# Patient Record
Sex: Female | Born: 1935 | Race: White | Hispanic: No | Smoking: Current some day smoker
Health system: Southern US, Community
[De-identification: ages and names within clinical notes are randomized; demographics above are authoritative.]

## PROBLEM LIST (undated history)

## (undated) DIAGNOSIS — R079 Chest pain, unspecified: Secondary | ICD-10-CM

## (undated) DIAGNOSIS — I10 Essential (primary) hypertension: Secondary | ICD-10-CM

## (undated) DIAGNOSIS — R479 Unspecified speech disturbances: Secondary | ICD-10-CM

## (undated) DIAGNOSIS — M2420 Disorder of ligament, unspecified site: Secondary | ICD-10-CM

## (undated) DIAGNOSIS — S329XXA Fracture of unspecified parts of lumbosacral spine and pelvis, initial encounter for closed fracture: Secondary | ICD-10-CM

## (undated) DIAGNOSIS — S32511D Fracture of superior rim of right pubis, subsequent encounter for fracture with routine healing: Secondary | ICD-10-CM

## (undated) DIAGNOSIS — I2510 Atherosclerotic heart disease of native coronary artery without angina pectoris: Secondary | ICD-10-CM

## (undated) DIAGNOSIS — F419 Anxiety disorder, unspecified: Secondary | ICD-10-CM

## (undated) DIAGNOSIS — R531 Weakness: Secondary | ICD-10-CM

## (undated) DIAGNOSIS — N289 Disorder of kidney and ureter, unspecified: Secondary | ICD-10-CM

## (undated) DIAGNOSIS — D649 Anemia, unspecified: Secondary | ICD-10-CM

## (undated) DIAGNOSIS — G309 Alzheimer's disease, unspecified: Secondary | ICD-10-CM

## (undated) DIAGNOSIS — M419 Scoliosis, unspecified: Secondary | ICD-10-CM

## (undated) DIAGNOSIS — M629 Disorder of muscle, unspecified: Secondary | ICD-10-CM

## (undated) DIAGNOSIS — I82409 Acute embolism and thrombosis of unspecified deep veins of unspecified lower extremity: Secondary | ICD-10-CM

## (undated) DIAGNOSIS — F0280 Dementia in other diseases classified elsewhere without behavioral disturbance: Secondary | ICD-10-CM

## (undated) DIAGNOSIS — E039 Hypothyroidism, unspecified: Secondary | ICD-10-CM

## (undated) DIAGNOSIS — F99 Mental disorder, not otherwise specified: Secondary | ICD-10-CM

## (undated) DIAGNOSIS — R1310 Dysphagia, unspecified: Secondary | ICD-10-CM

## (undated) HISTORY — PX: SKIN GRAFT: SHX250

## (undated) HISTORY — PX: CHOLECYSTECTOMY: SHX55

---

## 1998-04-22 ENCOUNTER — Ambulatory Visit (HOSPITAL_COMMUNITY): Admission: RE | Admit: 1998-04-22 | Discharge: 1998-04-22 | Payer: Self-pay | Admitting: Internal Medicine

## 1998-05-20 ENCOUNTER — Other Ambulatory Visit: Admission: RE | Admit: 1998-05-20 | Discharge: 1998-05-20 | Payer: Self-pay | Admitting: Internal Medicine

## 1998-10-05 ENCOUNTER — Ambulatory Visit (HOSPITAL_COMMUNITY): Admission: RE | Admit: 1998-10-05 | Discharge: 1998-10-05 | Payer: Self-pay | Admitting: Internal Medicine

## 1999-10-15 ENCOUNTER — Ambulatory Visit (HOSPITAL_COMMUNITY): Admission: RE | Admit: 1999-10-15 | Discharge: 1999-10-15 | Payer: Self-pay | Admitting: Internal Medicine

## 1999-10-15 ENCOUNTER — Encounter: Payer: Self-pay | Admitting: Internal Medicine

## 2000-11-16 ENCOUNTER — Ambulatory Visit (HOSPITAL_COMMUNITY): Admission: RE | Admit: 2000-11-16 | Discharge: 2000-11-16 | Payer: Self-pay | Admitting: Internal Medicine

## 2000-11-16 ENCOUNTER — Encounter: Payer: Self-pay | Admitting: Internal Medicine

## 2001-11-19 ENCOUNTER — Ambulatory Visit (HOSPITAL_COMMUNITY): Admission: RE | Admit: 2001-11-19 | Discharge: 2001-11-19 | Payer: Self-pay | Admitting: Internal Medicine

## 2001-11-19 ENCOUNTER — Encounter: Payer: Self-pay | Admitting: Internal Medicine

## 2002-11-20 ENCOUNTER — Ambulatory Visit (HOSPITAL_COMMUNITY): Admission: RE | Admit: 2002-11-20 | Discharge: 2002-11-20 | Payer: Self-pay | Admitting: Internal Medicine

## 2002-11-20 ENCOUNTER — Encounter: Payer: Self-pay | Admitting: Internal Medicine

## 2003-11-24 ENCOUNTER — Ambulatory Visit (HOSPITAL_COMMUNITY): Admission: RE | Admit: 2003-11-24 | Discharge: 2003-11-24 | Payer: Self-pay | Admitting: Internal Medicine

## 2004-12-06 ENCOUNTER — Ambulatory Visit (HOSPITAL_COMMUNITY): Admission: RE | Admit: 2004-12-06 | Discharge: 2004-12-06 | Payer: Self-pay | Admitting: Internal Medicine

## 2006-01-03 ENCOUNTER — Ambulatory Visit (HOSPITAL_COMMUNITY): Admission: RE | Admit: 2006-01-03 | Discharge: 2006-01-03 | Payer: Self-pay | Admitting: Internal Medicine

## 2007-02-07 ENCOUNTER — Ambulatory Visit (HOSPITAL_COMMUNITY): Admission: RE | Admit: 2007-02-07 | Discharge: 2007-02-07 | Payer: Self-pay | Admitting: Internal Medicine

## 2007-02-20 ENCOUNTER — Encounter: Admission: RE | Admit: 2007-02-20 | Discharge: 2007-02-20 | Payer: Self-pay | Admitting: Internal Medicine

## 2008-03-12 ENCOUNTER — Ambulatory Visit (HOSPITAL_COMMUNITY): Admission: RE | Admit: 2008-03-12 | Discharge: 2008-03-12 | Payer: Self-pay | Admitting: Internal Medicine

## 2009-06-16 ENCOUNTER — Ambulatory Visit (HOSPITAL_COMMUNITY): Admission: RE | Admit: 2009-06-16 | Discharge: 2009-06-16 | Payer: Self-pay | Admitting: Internal Medicine

## 2009-10-19 ENCOUNTER — Ambulatory Visit (HOSPITAL_COMMUNITY): Admission: RE | Admit: 2009-10-19 | Discharge: 2009-10-19 | Payer: Self-pay | Admitting: Internal Medicine

## 2010-12-12 ENCOUNTER — Encounter: Payer: Self-pay | Admitting: Internal Medicine

## 2011-11-22 DIAGNOSIS — R079 Chest pain, unspecified: Secondary | ICD-10-CM

## 2011-11-22 HISTORY — DX: Chest pain, unspecified: R07.9

## 2012-01-20 ENCOUNTER — Other Ambulatory Visit: Payer: Self-pay | Admitting: Internal Medicine

## 2012-01-20 DIAGNOSIS — R4182 Altered mental status, unspecified: Secondary | ICD-10-CM

## 2012-01-20 DIAGNOSIS — R413 Other amnesia: Secondary | ICD-10-CM

## 2012-01-23 ENCOUNTER — Ambulatory Visit
Admission: RE | Admit: 2012-01-23 | Discharge: 2012-01-23 | Disposition: A | Discharge status: Home / Self Care | Payer: Medicare Other | Source: Ambulatory Visit | Attending: Internal Medicine | Admitting: Internal Medicine

## 2012-01-23 DIAGNOSIS — R4182 Altered mental status, unspecified: Secondary | ICD-10-CM

## 2012-01-23 DIAGNOSIS — R413 Other amnesia: Secondary | ICD-10-CM

## 2012-04-23 ENCOUNTER — Observation Stay (HOSPITAL_COMMUNITY)
Admission: EM | Admit: 2012-04-23 | Discharge: 2012-04-27 | Disposition: A | Discharge status: Skilled Nursing Facility | Payer: Medicare Other | Attending: Internal Medicine | Admitting: Internal Medicine

## 2012-04-23 ENCOUNTER — Encounter (HOSPITAL_COMMUNITY): Payer: Self-pay | Admitting: *Deleted

## 2012-04-23 ENCOUNTER — Emergency Department (HOSPITAL_COMMUNITY): Payer: Medicare Other

## 2012-04-23 DIAGNOSIS — N19 Unspecified kidney failure: Secondary | ICD-10-CM

## 2012-04-23 DIAGNOSIS — R079 Chest pain, unspecified: Principal | ICD-10-CM | POA: Insufficient documentation

## 2012-04-23 DIAGNOSIS — N289 Disorder of kidney and ureter, unspecified: Secondary | ICD-10-CM | POA: Insufficient documentation

## 2012-04-23 DIAGNOSIS — N179 Acute kidney failure, unspecified: Secondary | ICD-10-CM | POA: Insufficient documentation

## 2012-04-23 DIAGNOSIS — I10 Essential (primary) hypertension: Secondary | ICD-10-CM | POA: Diagnosis present

## 2012-04-23 DIAGNOSIS — E039 Hypothyroidism, unspecified: Secondary | ICD-10-CM | POA: Diagnosis present

## 2012-04-23 DIAGNOSIS — E032 Hypothyroidism due to medicaments and other exogenous substances: Secondary | ICD-10-CM

## 2012-04-23 DIAGNOSIS — N200 Calculus of kidney: Secondary | ICD-10-CM | POA: Insufficient documentation

## 2012-04-23 DIAGNOSIS — R0609 Other forms of dyspnea: Secondary | ICD-10-CM | POA: Insufficient documentation

## 2012-04-23 DIAGNOSIS — R0989 Other specified symptoms and signs involving the circulatory and respiratory systems: Secondary | ICD-10-CM | POA: Insufficient documentation

## 2012-04-23 DIAGNOSIS — F0390 Unspecified dementia without behavioral disturbance: Secondary | ICD-10-CM | POA: Insufficient documentation

## 2012-04-23 HISTORY — DX: Hypothyroidism, unspecified: E03.9

## 2012-04-23 HISTORY — DX: Anxiety disorder, unspecified: F41.9

## 2012-04-23 HISTORY — DX: Mental disorder, not otherwise specified: F99

## 2012-04-23 HISTORY — DX: Essential (primary) hypertension: I10

## 2012-04-23 LAB — CBC
HCT: 43.4 % (ref 36.0–46.0)
Hemoglobin: 15.3 g/dL — ABNORMAL HIGH (ref 12.0–15.0)
MCHC: 35.3 g/dL (ref 30.0–36.0)
Platelets: 209 10*3/uL (ref 150–400)
RBC: 4.64 MIL/uL (ref 3.87–5.11)
RDW: 13.8 % (ref 11.5–15.5)

## 2012-04-23 LAB — POCT I-STAT 3, VENOUS BLOOD GAS (G3P V)
Acid-Base Excess: 1 mmol/L (ref 0.0–2.0)
TCO2: 30 mmol/L (ref 0–100)
pCO2, Ven: 52.9 mmHg — ABNORMAL HIGH (ref 45.0–50.0)

## 2012-04-23 LAB — BASIC METABOLIC PANEL
Chloride: 103 mEq/L (ref 96–112)
Creatinine, Ser: 2.33 mg/dL — ABNORMAL HIGH (ref 0.50–1.10)

## 2012-04-23 LAB — CK TOTAL AND CKMB (NOT AT ARMC)
CK, MB: 4.7 ng/mL — ABNORMAL HIGH (ref 0.3–4.0)
Total CK: 122 U/L (ref 7–177)

## 2012-04-23 MED ORDER — ASPIRIN 81 MG PO CHEW
324.0000 mg | CHEWABLE_TABLET | Freq: Once | ORAL | Status: AC
  Administered 2012-04-23: 324 mg via ORAL
  Filled 2012-04-23: qty 4

## 2012-04-23 NOTE — ED Provider Notes (Signed)
History     CSN: [id number]  Arrival date & time 04/23/12  2002   First MD Initiated Contact with Patient 04/23/12 2301      Chief Complaint  Patient presents with  . Chest Pain    (Consider location/radiation/quality/duration/timing/severity/associated sxs/prior treatment) The history is provided by the patient.   lives alone, does have mild dementia. She called to her son and daughter tonight stating she is having chest pain, difficulty breathing, and breaking out into a sweat. The symptoms lasted about 2 hours until she got to the emergency department and now is pain-free. Pain described as heaviness and substernal and not radiating. No recent illness. No cough or fevers. No leg pain or leg swelling. No history of similar symptoms. Patient does have history of hypertension but no known history of heart problems. Moderate in severity. No known aggravating or alleviating factors.  No past medical history on file.  No past surgical history on file.  History reviewed. No pertinent family history.  History  Substance Use Topics  . Smoking status: Never Smoker   . Smokeless tobacco: Not on file  . Alcohol Use: No    OB History    Grav Para Term Preterm Abortions TAB SAB Ect Mult Living                  Review of Systems  Constitutional: Negative for fever and chills.  HENT: Negative for neck pain and neck stiffness.   Eyes: Negative for pain.  Respiratory: Positive for shortness of breath.   Cardiovascular: Positive for chest pain.  Gastrointestinal: Negative for abdominal pain.  Genitourinary: Negative for dysuria.  Musculoskeletal: Negative for back pain.  Skin: Negative for rash.  Neurological: Negative for headaches.  All other systems reviewed and are negative.    Allergies  Review of patient's allergies indicates not on file.  Home Medications  No current outpatient prescriptions on file.  BP 134/68  Pulse 70  Temp(Src) 98.1 F (36.7 C) (Oral)  Resp 16   SpO2 99%  Physical Exam  Constitutional: She is oriented to person, place, and time. She appears well-developed and well-nourished.  HENT:  Head: Normocephalic and atraumatic.  Eyes: Conjunctivae and EOM are normal. Pupils are equal, round, and reactive to light.  Neck: Trachea normal. Neck supple. No thyromegaly present.  Cardiovascular: Normal rate, regular rhythm, S1 normal, S2 normal and normal pulses.     No systolic murmur is present   No diastolic murmur is present  Pulses:      Radial pulses are 2+ on the right side, and 2+ on the left side.  Pulmonary/Chest: Effort normal and breath sounds normal. She has no wheezes. She has no rhonchi. She has no rales. She exhibits no tenderness.  Abdominal: Soft. Normal appearance and bowel sounds are normal. There is no tenderness. There is no CVA tenderness and negative Murphy's sign.  Musculoskeletal:       BLE:s Calves nontender, no cords or erythema, negative Homans sign  Neurological: She is alert and oriented to person, place, and time. She has normal strength. No cranial nerve deficit or sensory deficit. GCS eye subscore is 4. GCS verbal subscore is 5. GCS motor subscore is 6.  Skin: Skin is warm and dry. No rash noted. She is not diaphoretic.  Psychiatric: Her speech is normal.       Cooperative and appropriate    ED Course  Procedures (including critical care time)  Labs Reviewed  CBC - Abnormal; Notable for the  following:    Hemoglobin 15.3 (*)    All other components within normal limits  BASIC METABOLIC PANEL - Abnormal; Notable for the following:    BUN 55 (*)    Creatinine, Ser 2.33 (*)    GFR calc non Af Amer 18 (*)    GFR calc Af Amer 21 (*)    All other components within normal limits  CK TOTAL AND CKMB - Abnormal; Notable for the following:    CK, MB 4.7 (*)    Relative Index 3.9 (*)    All other components within normal limits  POCT I-STAT 3, BLOOD GAS (G3P V) - Abnormal; Notable for the following:    pH,  Ven 7.334 (*)    pCO2, Ven 52.9 (*)    pO2, Ven 18.0 (*)    Bicarbonate 28.2 (*)    All other components within normal limits  POCT I-STAT TROPONIN I   Dg Chest 2 View  04/23/2012  *RADIOLOGY REPORT*  Clinical Data: Chest pain.  CHEST - 2 VIEW  Comparison: 10/19/2009  Findings: Hyperexpansion is consistent with emphysema. The lungs are clear without focal infiltrate, edema, pneumothorax or pleural effusion. The cardiopericardial silhouette is within normal limits for size. Bones are diffusely demineralized.  IMPRESSION: Stable.  Emphysema without acute cardiopulmonary process.  Original Report Authenticated By: ERIC A. MANSELL, M.D.     Date: 04/23/2012  Rate: 70  Rhythm: normal sinus rhythm  QRS Axis: normal  Intervals: normal  ST/T Wave abnormalities: nonspecific ST changes  Conduction Disutrbances:none  Narrative Interpretation:   Old EKG Reviewed: none available  ASA PO. Pain free at time of my evaluation.   11:25 PM d/w Dr Mikeal Hawthorne, on call for [facility], will admit.    MDM   CP concerning for possible ACS in 78 yo female with h/o HTN, o/w without cardiac history. Work up as above, stat ECG reviewed, CXR and labs reviewed and MED c/s for admit.         Sunnie Nielsen, MD 04/24/12 [phone]

## 2012-04-23 NOTE — ED Notes (Signed)
Patient with two days of intermittent chest pain that radiates to her neck then down her cental chest to her epigastric area.  Describes pain as tightness.  Patient also experienced dizziness and shortness of breath with diaphoresis today.  Patient is pain free at this time.

## 2012-04-24 ENCOUNTER — Encounter (HOSPITAL_COMMUNITY): Payer: Self-pay | Admitting: Internal Medicine

## 2012-04-24 ENCOUNTER — Observation Stay (HOSPITAL_COMMUNITY): Payer: Medicare Other

## 2012-04-24 DIAGNOSIS — I10 Essential (primary) hypertension: Secondary | ICD-10-CM

## 2012-04-24 DIAGNOSIS — N19 Unspecified kidney failure: Secondary | ICD-10-CM

## 2012-04-24 DIAGNOSIS — F0390 Unspecified dementia without behavioral disturbance: Secondary | ICD-10-CM | POA: Diagnosis present

## 2012-04-24 DIAGNOSIS — E032 Hypothyroidism due to medicaments and other exogenous substances: Secondary | ICD-10-CM

## 2012-04-24 DIAGNOSIS — E039 Hypothyroidism, unspecified: Secondary | ICD-10-CM | POA: Diagnosis present

## 2012-04-24 DIAGNOSIS — R079 Chest pain, unspecified: Secondary | ICD-10-CM | POA: Diagnosis present

## 2012-04-24 DIAGNOSIS — N289 Disorder of kidney and ureter, unspecified: Secondary | ICD-10-CM | POA: Diagnosis present

## 2012-04-24 LAB — CARDIAC PANEL(CRET KIN+CKTOT+MB+TROPI)
CK, MB: 4 ng/mL (ref 0.3–4.0)
CK, MB: 4.2 ng/mL — ABNORMAL HIGH (ref 0.3–4.0)
Total CK: 106 U/L (ref 7–177)
Troponin I: <0.3 ng/mL (ref <0.30)
Troponin I: <0.3 ng/mL (ref <0.30)

## 2012-04-24 LAB — COMPREHENSIVE METABOLIC PANEL
Alkaline Phosphatase: 38 U/L — ABNORMAL LOW (ref 39–117)
BUN: 47 mg/dL — ABNORMAL HIGH (ref 6–23)
CO2: 24 mEq/L (ref 19–32)
GFR calc Af Amer: 24 mL/min — ABNORMAL LOW (ref >90)
GFR calc non Af Amer: 21 mL/min — ABNORMAL LOW (ref >90)
Glucose, Bld: 83 mg/dL (ref 70–99)
Potassium: 4.2 mEq/L (ref 3.5–5.1)
Total Protein: 5.9 g/dL — ABNORMAL LOW (ref 6.0–8.3)

## 2012-04-24 LAB — LIPID PANEL
Cholesterol: 196 mg/dL (ref 0–200)
Total CHOL/HDL Ratio: 3 RATIO
Triglycerides: 86 mg/dL (ref <150)
VLDL: 17 mg/dL (ref 0–40)

## 2012-04-24 LAB — CBC
HCT: 41.7 % (ref 36.0–46.0)
Platelets: 186 10*3/uL (ref 150–400)
RDW: 13.6 % (ref 11.5–15.5)
WBC: 7.9 10*3/uL (ref 4.0–10.5)

## 2012-04-24 LAB — TSH: TSH: 5.278 u[IU]/mL — ABNORMAL HIGH (ref 0.350–4.500)

## 2012-04-24 MED ORDER — DOCUSATE SODIUM 100 MG PO CAPS
100.0000 mg | ORAL_CAPSULE | Freq: Two times a day (BID) | ORAL | Status: DC
  Administered 2012-04-24 – 2012-04-27 (×7): 100 mg via ORAL
  Filled 2012-04-24 (×8): qty 1

## 2012-04-24 MED ORDER — ENOXAPARIN SODIUM 30 MG/0.3ML ~~LOC~~ SOLN
30.0000 mg | Freq: Every day | SUBCUTANEOUS | Status: DC
  Administered 2012-04-24 – 2012-04-27 (×4): 30 mg via SUBCUTANEOUS
  Filled 2012-04-24 (×4): qty 0.3

## 2012-04-24 MED ORDER — ACETAMINOPHEN 650 MG RE SUPP: 650.0000 mg | Freq: Four times a day (QID) | RECTAL | Status: DC | PRN

## 2012-04-24 MED ORDER — NITROGLYCERIN 0.4 MG SL SUBL: 0.4000 mg | SUBLINGUAL_TABLET | SUBLINGUAL | Status: DC | PRN

## 2012-04-24 MED ORDER — ENSURE COMPLETE PO LIQD: 237.0000 mL | Freq: Two times a day (BID) | ORAL | Status: DC | PRN

## 2012-04-24 MED ORDER — METOPROLOL TARTRATE 12.5 MG HALF TABLET
12.5000 mg | ORAL_TABLET | Freq: Two times a day (BID) | ORAL | Status: DC
  Administered 2012-04-24 – 2012-04-27 (×6): 12.5 mg via ORAL
  Filled 2012-04-24 (×8): qty 1

## 2012-04-24 MED ORDER — MORPHINE SULFATE 2 MG/ML IJ SOLN: 1.0000 mg | INTRAMUSCULAR | Status: DC | PRN

## 2012-04-24 MED ORDER — ALUM & MAG HYDROXIDE-SIMETH 200-200-20 MG/5ML PO SUSP: 30.0000 mL | Freq: Four times a day (QID) | ORAL | Status: DC | PRN

## 2012-04-24 MED ORDER — ACETAMINOPHEN 325 MG PO TABS: 650.0000 mg | ORAL_TABLET | Freq: Four times a day (QID) | ORAL | Status: DC | PRN

## 2012-04-24 MED ORDER — SODIUM CHLORIDE 0.9 % IJ SOLN
3.0000 mL | Freq: Two times a day (BID) | INTRAMUSCULAR | Status: DC
  Administered 2012-04-24 – 2012-04-27 (×6): 3 mL via INTRAVENOUS

## 2012-04-24 MED ORDER — HYDROCHLOROTHIAZIDE 12.5 MG PO CAPS: 12.5000 mg | ORAL_CAPSULE | Freq: Every day | ORAL | Status: DC

## 2012-04-24 MED ORDER — ASPIRIN EC 81 MG PO TBEC
81.0000 mg | DELAYED_RELEASE_TABLET | Freq: Every day | ORAL | Status: DC
  Administered 2012-04-24 – 2012-04-27 (×4): 81 mg via ORAL
  Filled 2012-04-24 (×4): qty 1

## 2012-04-24 MED ORDER — SODIUM CHLORIDE 0.9 % IV SOLN
INTRAVENOUS | Status: DC
  Administered 2012-04-24: 1000 mL via INTRAVENOUS
  Administered 2012-04-25 – 2012-04-27 (×2): via INTRAVENOUS

## 2012-04-24 NOTE — Progress Notes (Signed)
Nephew : CM Colbreth [phone] (H) [phone] (C)

## 2012-04-24 NOTE — Progress Notes (Signed)
Reason for Consult: Chest pain  Requesting Physician: [facility]  HPI: This is a 78 y.o. female with a past medical history significant for dementia though she lives alone with a nephew's help. There is no documented history of CAD or MI. She is unable to give me any history and cant tell me why she has been admitted. ER records indicate chest pain as the presenting complaint. Troponin negative X 2. Telemetry shows NSR SB with rate of 50. No EKG is available to me.   PMHx:  Past Medical History  Diagnosis Date  . Hypertension   . Hypothyroidism   . Mental disorder   . DEMENTIA   . Anxiety    History reviewed. No pertinent past surgical history.  FAMHx: Family History  Problem Relation Age of Onset  . Coronary aneurysm Father   . Heart failure Sister     SOCHx:  reports that she has been smoking.  She does not have any smokeless tobacco history on file. She reports that she does not drink alcohol. Her drug history not on file.  ALLERGIES: Allergies  Allergen Reactions  . Aricept (Donepezil Hcl) Nausea And Vomiting    ROS: Review of systems not obtained due to patient factors.  HOME MEDICATIONS: Prescriptions prior to admission  Medication Sig Dispense Refill  . hydrochlorothiazide (HYDRODIURIL) 25 MG tablet Take 25 mg by mouth daily.      Marland Kitchen levothyroxine (SYNTHROID, LEVOTHROID) 50 MCG tablet Take 50 mcg by mouth daily.        HOSPITAL MEDICATIONS: I have reviewed the patient's current medications.  VITALS: Blood pressure 140/69, pulse 65, temperature 98 F (36.7 C), temperature source Oral, resp. rate 18, height 5\' 9"  (1.753 m), weight 51.4 kg (113 lb 5.1 oz), SpO2 96.00%.  PHYSICAL EXAM: General appearance: alert, cooperative, no distress and unable to give any history or details  Neck: no carotid bruit, no JVD, supple, symmetrical, trachea midline and thyroid not enlarged, symmetric, no tenderness/mass/nodules Lungs: clear to auscultation bilaterally Heart:  regular rate and rhythm Abdomen: soft, non-tender; bowel sounds normal; no masses,  no organomegaly Extremities: extremities normal, atraumatic, no cyanosis or edema Pulses: 2+ and symmetric Skin: chronic venous changes in both LE Neurologic: Grossly normal, she is awake and alert, follows commands No signs of falls- no bruising or old injury noted  LABS: Results for orders placed during the hospital encounter of 04/23/12 (from the past 48 hour(s))  CBC     Status: Abnormal   Collection Time   04/23/12  8:46 PM      Component Value Range Comment   WBC 7.6  4.0 - 10.5 (K/uL)    RBC 4.64  3.87 - 5.11 (MIL/uL)    Hemoglobin 15.3 (*) 12.0 - 15.0 (g/dL)    HCT 45.4  09.8 - 11.9 (%)    MCV 93.5  78.0 - 100.0 (fL)    MCH 33.0  26.0 - 34.0 (pg)    MCHC 35.3  30.0 - 36.0 (g/dL)    RDW 14.7  82.9 - 56.2 (%)    Platelets 209  150 - 400 (K/uL)   BASIC METABOLIC PANEL     Status: Abnormal   Collection Time   04/23/12  8:46 PM      Component Value Range Comment   Sodium 137  135 - 145 (mEq/L)    Potassium 4.7  3.5 - 5.1 (mEq/L)    Chloride 103  96 - 112 (mEq/L)    CO2 23  19 - 32 (  mEq/L)    Glucose, Bld 92  70 - 99 (mg/dL)    BUN 55 (*) 6 - 23 (mg/dL)    Creatinine, Ser 1.61 (*) 0.50 - 1.10 (mg/dL)    Calcium 9.6  8.4 - 10.5 (mg/dL)    GFR calc non Af Amer 18 (*) >90 (mL/min)    GFR calc Af Amer 21 (*) >90 (mL/min)   CK TOTAL AND CKMB     Status: Abnormal   Collection Time   04/23/12  8:46 PM      Component Value Range Comment   Total CK 122  7 - 177 (U/L)    CK, MB 4.7 (*) 0.3 - 4.0 (ng/mL)    Relative Index 3.9 (*) 0.0 - 2.5    POCT I-STAT TROPONIN I     Status: Normal   Collection Time   04/23/12  9:03 PM      Component Value Range Comment   Troponin i, poc 0.00  0.00 - 0.08 (ng/mL)    Comment 3            POCT I-STAT 3, BLOOD GAS (G3P V)     Status: Abnormal   Collection Time   04/23/12  9:06 PM      Component Value Range Comment   pH, Ven 7.334 (*) 7.250 - 7.300     pCO2, Ven 52.9  (*) 45.0 - 50.0 (mmHg)    pO2, Ven 18.0 (*) 30.0 - 45.0 (mmHg)    Bicarbonate 28.2 (*) 20.0 - 24.0 (mEq/L)    TCO2 30  0 - 100 (mmol/L)    O2 Saturation 22.0      Acid-Base Excess 1.0  0.0 - 2.0 (mmol/L)    Sample type VENOUS     LIPID PANEL     Status: Abnormal   Collection Time   04/24/12 12:21 AM      Component Value Range Comment   Cholesterol 196  0 - 200 (mg/dL)    Triglycerides 86  <096 (mg/dL)    HDL 66  >04 (mg/dL)    Total CHOL/HDL Ratio 3.0      VLDL 17  0 - 40 (mg/dL)    LDL Cholesterol 540 (*) 0 - 99 (mg/dL)   CARDIAC PANEL(CRET KIN+CKTOT+MB+TROPI)     Status: Abnormal   Collection Time   04/24/12  1:37 AM      Component Value Range Comment   Total CK 124  7 - 177 (U/L)    CK, MB 4.6 (*) 0.3 - 4.0 (ng/mL)    Troponin I <0.30  <0.30 (ng/mL)    Relative Index 3.7 (*) 0.0 - 2.5    COMPREHENSIVE METABOLIC PANEL     Status: Abnormal   Collection Time   04/24/12  5:50 AM      Component Value Range Comment   Sodium 141  135 - 145 (mEq/L)    Potassium 4.2  3.5 - 5.1 (mEq/L)    Chloride 106  96 - 112 (mEq/L)    CO2 24  19 - 32 (mEq/L)    Glucose, Bld 83  70 - 99 (mg/dL)    BUN 47 (*) 6 - 23 (mg/dL)    Creatinine, Ser 9.81 (*) 0.50 - 1.10 (mg/dL)    Calcium 8.9  8.4 - 10.5 (mg/dL)    Total Protein 5.9 (*) 6.0 - 8.3 (g/dL)    Albumin 3.5  3.5 - 5.2 (g/dL)    AST 17  0 - 37 (U/L)    ALT 15  0 - 35 (U/L)    Alkaline Phosphatase 38 (*) 39 - 117 (U/L)    Total Bilirubin 0.4  0.3 - 1.2 (mg/dL)    GFR calc non Af Amer 21 (*) >90 (mL/min)    GFR calc Af Amer 24 (*) >90 (mL/min)   CBC     Status: Normal   Collection Time   04/24/12  5:50 AM      Component Value Range Comment   WBC 7.9  4.0 - 10.5 (K/uL)    RBC 4.43  3.87 - 5.11 (MIL/uL)    Hemoglobin 14.4  12.0 - 15.0 (g/dL)    HCT 16.1  09.6 - 04.5 (%)    MCV 94.1  78.0 - 100.0 (fL)    MCH 32.5  26.0 - 34.0 (pg)    MCHC 34.5  30.0 - 36.0 (g/dL)    RDW 40.9  81.1 - 91.4 (%)    Platelets 186  150 - 400 (K/uL)   TSH      Status: Abnormal   Collection Time   04/24/12  5:50 AM      Component Value Range Comment   TSH 5.278 (*) 0.350 - 4.500 (uIU/mL)   CARDIAC PANEL(CRET KIN+CKTOT+MB+TROPI)     Status: Normal   Collection Time   04/24/12  9:28 AM      Component Value Range Comment   Total CK 99  7 - 177 (U/L)    CK, MB 4.0  0.3 - 4.0 (ng/mL)    Troponin I <0.30  <0.30 (ng/mL)    Relative Index RELATIVE INDEX IS INVALID  0.0 - 2.5      IMAGING: Dg Chest 2 View  04/23/2012  *RADIOLOGY REPORT*  Clinical Data: Chest pain.  CHEST - 2 VIEW  Comparison: 10/19/2009  Findings: Hyperexpansion is consistent with emphysema. The lungs are clear without focal infiltrate, edema, pneumothorax or pleural effusion. The cardiopericardial silhouette is within normal limits for size. Bones are diffusely demineralized.  IMPRESSION: Stable.  Emphysema without acute cardiopulmonary process.  Original Report Authenticated By: ERIC A. MANSELL, M.D.   US Renal  04/24/2012  *RADIOLOGY REPORT*  Clinical Data:  Renal failure  RENAL/URINARY TRACT ULTRASOUND COMPLETE  Comparison:  None  Findings:  Right Kidney:  Measures 10.7 cm  Normal in size and parenchymal echogenicity. Right pelvocaliectasis without evidence for hydronephrosis or mass.  Left Kidney:  Measures 8.4 cm. Smaller than the right.  Normal echogenicity.  Small stone within the inferior pole collecting system of the left kidney measures 7 mm. No evidence of mass or hydronephrosis.  Bladder:  Appears normal for degree of bladder distention.  IMPRESSION: 1.  Right pelvocaliectasis. 2.  Left renal calculi without evidence for hydronephrosis.  Original Report Authenticated By: Rosealee Albee, M.D.    IMPRESSION: Principal Problem:  *Chest pain Active Problems:  HTN (hypertension)  Dementia  Renal insufficiency  Hypothyroidism   RECOMMENDATION: MD to see. 2D pending. Doubt aggressive w/u indicated.  Time Spent Directly with Patient: 40 minutes  KILROY,LUKE K 04/24/2012, 1:33  PM   Agree with note written by [facility]  Pt admitted with CP. Only CRF is treated HTN. She has moderate dementia and lives alone. Currently pain free. No prior cardiac history. Exam benign. Enz neg. EKG w/o acute changes. Await result of 2D. Watch over night. Home AM. No pl;ans for functional study.  Runell Gess 04/24/2012 1:37 PM

## 2012-04-24 NOTE — Progress Notes (Signed)
Subjective: Pt denies any further chest pain today, no SOB, no N/V. Nephew/POA at the bedside  Objective: Vital signs in last 24 hours: Temp:  [97.8 F (36.6 C)-98.1 F (36.7 C)] 98 F (36.7 C) (06/04 0505) Pulse Rate:  [58-71] 65  (06/04 0505) Resp:  [16-20] 18  (06/04 0505) BP: (134-166)/(61-69) 140/69 mmHg (06/04 0505) SpO2:  [96 %-100 %] 96 % (06/04 0505) Weight:  [51.4 kg (113 lb 5.1 oz)] 51.4 kg (113 lb 5.1 oz) (06/04 0039) Last BM Date: 04/22/12 Intake/Output from previous day: 06/03 0701 - 06/04 0700 In: 3 [I.V.:3] Out: 150 [Urine:150] Intake/Output this shift:      General Appearance:    Alert and appropriate, cooperative, no distress, appears stated age  Lungs:     Clear to auscultation bilaterally, respirations unlabored   Heart:    Regular rate and rhythm, S1 and S2 normal, no murmur, rub   or gallop  Abdomen:     Soft, non-tender, bowel sounds present,    no masses, no organomegaly  Extremities:   Extremities normal, atraumatic, no cyanosis or edema  Neurologic:   CNII-XII intact, normal strength, nonfocal       Weight change:   Intake/Output Summary (Last 24 hours) at 04/24/12 1215 Last data filed at 04/24/12 0500  Gross per 24 hour  Intake      3 ml  Output    150 ml  Net   -147 ml    Lab Results:   [facility] 04/24/12 0550 04/23/12 2046  NA 141 137  K 4.2 4.7  CL 106 103  CO2 24 23  GLUCOSE 83 92  BUN 47* 55*  CREATININE 2.10* 2.33*  CALCIUM 8.9 9.6    Basename 04/24/12 0550 04/23/12 2046  WBC 7.9 7.6  HGB 14.4 15.3*  HCT 41.7 43.4  PLT 186 209  MCV 94.1 93.5   PT/INR No results found for this basename: LABPROT:2,INR:2 in the last 72 hours ABG  Basename 04/23/12 2106  PHART --  HCO3 28.2*    Micro Results: No results found for this or any previous visit (from the past 240 hour(s)). Studies/Results: Dg Chest 2 View  04/23/2012  *RADIOLOGY REPORT*  Clinical Data: Chest pain.  CHEST - 2 VIEW  Comparison: 10/19/2009  Findings:  Hyperexpansion is consistent with emphysema. The lungs are clear without focal infiltrate, edema, pneumothorax or pleural effusion. The cardiopericardial silhouette is within normal limits for size. Bones are diffusely demineralized.  IMPRESSION: Stable.  Emphysema without acute cardiopulmonary process.  Original Report Authenticated By: ERIC A. MANSELL, M.D.   US Renal  04/24/2012  *RADIOLOGY REPORT*  Clinical Data:  Renal failure  RENAL/URINARY TRACT ULTRASOUND COMPLETE  Comparison:  None  Findings:  Right Kidney:  Measures 10.7 cm  Normal in size and parenchymal echogenicity. Right pelvocaliectasis without evidence for hydronephrosis or mass.  Left Kidney:  Measures 8.4 cm. Smaller than the right.  Normal echogenicity.  Small stone within the inferior pole collecting system of the left kidney measures 7 mm. No evidence of mass or hydronephrosis.  Bladder:  Appears normal for degree of bladder distention.  IMPRESSION: 1.  Right pelvocaliectasis. 2.  Left renal calculi without evidence for hydronephrosis.  Original Report Authenticated By: Rosealee Albee, M.D.   Medications:  Scheduled Meds:   . aspirin  324 mg Oral Once  . aspirin EC  81 mg Oral Daily  . docusate sodium  100 mg Oral BID  . enoxaparin  30 mg Subcutaneous Daily  .  sodium chloride  3 mL Intravenous Q12H  . DISCONTD: hydrochlorothiazide  12.5 mg Oral Daily   Continuous Infusions:   . sodium chloride 1,000 mL (04/24/12 0115)   PRN Meds:.acetaminophen, acetaminophen, alum & mag hydroxide-simeth, feeding supplement, morphine injection, nitroGLYCERIN Assessment/Plan: Patient Active Hospital Problem List: Chest pain (04/24/2012) -cardiac enzymes so far negative -continue ASA, prn nitrates -cardiology consulted for risk stratification/further recommendations, [facility] to see Renal Insufficiency- acute vs chronic -renal US with L. calculi but no hydronephrosis -HCTZ dc'ed, continue hydration follow and recheck  HTN (hypertension)  (04/24/2012) -HCTZ dc'ed as above, will place pt on B-blocker given #1 above, follow and titrate as appropriate   Hypothyroidism (04/24/2012) -resume synthroid, f/u on TSH and further adjust meds as appropriate  Dementia      LOS: 1 day   [name] C 04/24/2012, 12:15 PM

## 2012-04-24 NOTE — Progress Notes (Signed)
INITIAL ADULT NUTRITION ASSESSMENT Date: 04/24/2012   Time: 10:21 AM  Reason for Assessment: Underweight  ASSESSMENT: Female 78 y.o.  Dx: Chest pain  Hx:  Past Medical History  Diagnosis Date  . Hypertension   . Hypothyroidism   . Mental disorder   . DEMENTIA   . Anxiety     Related Meds:     . aspirin  324 mg Oral Once  . aspirin EC  81 mg Oral Daily  . docusate sodium  100 mg Oral BID  . enoxaparin  30 mg Subcutaneous Daily  . sodium chloride  3 mL Intravenous Q12H  . DISCONTD: hydrochlorothiazide  12.5 mg Oral Daily    Ht: 5\' 9"  (175.3 cm)  Wt: 113 lb 5.1 oz (51.4 kg)  Ideal Wt: 65.9 kg % Ideal Wt: 78%  Usual Wt: 117-113 lb % Usual Wt: 96-100%  Body mass index is 16.73 kg/(m^2).  Food/Nutrition Related Hx: no nutrition triggers per admission screen  Labs:  CMP     Component Value Date/Time   NA 141 04/24/2012 0550   K 4.2 04/24/2012 0550   CL 106 04/24/2012 0550   CO2 24 04/24/2012 0550   GLUCOSE 83 04/24/2012 0550   BUN 47* 04/24/2012 0550   CREATININE 2.10* 04/24/2012 0550   CALCIUM 8.9 04/24/2012 0550   PROT 5.9* 04/24/2012 0550   ALBUMIN 3.5 04/24/2012 0550   AST 17 04/24/2012 0550   ALT 15 04/24/2012 0550   ALKPHOS 38* 04/24/2012 0550   BILITOT 0.4 04/24/2012 0550   GFRNONAA 21* 04/24/2012 0550   GFRAA 24* 04/24/2012 0550     Intake/Output Summary (Last 24 hours) at 04/24/12 1022 Last data filed at 04/24/12 0500  Gross per 24 hour  Intake      3 ml  Output    150 ml  Net   -147 ml    Diet Order: Cardiac  Supplements/Tube Feeding: N/A  IVF:    sodium chloride Last Rate: 1,000 mL (04/24/12 0115)    Estimated Nutritional Needs:   Kcal: 1400-1600   Protein: 60-70 gm   Fluid: > 1.5 L  Patient presented with substernal chest pain on and off for 3 days; reports her appetite has been fair recently; usually consumes 1-2 meals per day; family state her weight fluctuates between 113 and 117 lb; no skin breakdown noted; patient underweight for height and would  benefit from additional calories & protein; amenable to Ensure Complete supplements PRN -- RD to order.  NUTRITION DIAGNOSIS: -Underweight (NI-3.1).  Status: Ongoing  RELATED TO: inadequate energy intake  AS EVIDENCE BY: BMI of 16.7 kg/m2  MONITORING/EVALUATION(Goals): Goal: Oral intake to meet >90% of estimated nutrition needs Monitor: PO intake, weight, labs, I/O's  EDUCATION NEEDS: -No education needs identified at this time  INTERVENTION:  Add Ensure Complete 2 times daily between meals PRN  RD to follow for nutrition care plan  Dietitian #: [phone]  DOCUMENTATION CODES Per approved criteria  -Underweight    Alger Memos 04/24/2012, 10:21 AM

## 2012-04-24 NOTE — H&P (Signed)
[name] is an 78 y.o. female.   Chief Complaint: Chest pain HPI: An 78 year old female relatively healthy with known history of hypertension presenting with substernal chest pain on and off for 3 days. Patient has apparently been complaining to the family that she is having exertional chest pain over the weekend. It got worse today that she decided to come to the emergency room. She is no evidence 6 and never been in the hospital. She has mild dementia but is awake alert and oriented x3. She is also able to do her ADLs as she lives alone. Chest pain was rated as 4/5 central,no radiation. Chest pain was relieved before she came to the emergency room. She denied any nausea vomiting or diaphoresis. Patient has risk factors for coronary artery disease including her hypertension, hypothyroidism, renal insufficiency as well as unknown cholesterol status.  Past Medical History  Diagnosis Date  . Hypertension   . Hypothyroidism   . Mental disorder   . DEMENTIA   . Anxiety     History reviewed. No pertinent past surgical history.  Family History  Problem Relation Age of Onset  . Coronary aneurysm Father   . Heart failure Sister    Social History:  reports that she has been smoking.  She does not have any smokeless tobacco history on file. She reports that she does not drink alcohol. Her drug history not on file.  Allergies:  Allergies  Allergen Reactions  . Aricept (Donepezil Hcl) Nausea And Vomiting    No prescriptions prior to admission    Results for orders placed during the hospital encounter of 04/23/12 (from the past 48 hour(s))  CBC     Status: Abnormal   Collection Time   04/23/12  8:46 PM      Component Value Range Comment   WBC 7.6  4.0 - 10.5 (K/uL)    RBC 4.64  3.87 - 5.11 (MIL/uL)    Hemoglobin 15.3 (*) 12.0 - 15.0 (g/dL)    HCT 40.9  81.1 - 91.4 (%)    MCV 93.5  78.0 - 100.0 (fL)    MCH 33.0  26.0 - 34.0 (pg)    MCHC 35.3  30.0 - 36.0 (g/dL)    RDW 78.2  95.6 - 21.3  (%)    Platelets 209  150 - 400 (K/uL)   BASIC METABOLIC PANEL     Status: Abnormal   Collection Time   04/23/12  8:46 PM      Component Value Range Comment   Sodium 137  135 - 145 (mEq/L)    Potassium 4.7  3.5 - 5.1 (mEq/L)    Chloride 103  96 - 112 (mEq/L)    CO2 23  19 - 32 (mEq/L)    Glucose, Bld 92  70 - 99 (mg/dL)    BUN 55 (*) 6 - 23 (mg/dL)    Creatinine, Ser 0.86 (*) 0.50 - 1.10 (mg/dL)    Calcium 9.6  8.4 - 10.5 (mg/dL)    GFR calc non Af Amer 18 (*) >90 (mL/min)    GFR calc Af Amer 21 (*) >90 (mL/min)   CK TOTAL AND CKMB     Status: Abnormal   Collection Time   04/23/12  8:46 PM      Component Value Range Comment   Total CK 122  7 - 177 (U/L)    CK, MB 4.7 (*) 0.3 - 4.0 (ng/mL)    Relative Index 3.9 (*) 0.0 - 2.5    POCT  I-STAT TROPONIN I     Status: Normal   Collection Time   04/23/12  9:03 PM      Component Value Range Comment   Troponin i, poc 0.00  0.00 - 0.08 (ng/mL)    Comment 3            POCT I-STAT 3, BLOOD GAS (G3P V)     Status: Abnormal   Collection Time   04/23/12  9:06 PM      Component Value Range Comment   pH, Ven 7.334 (*) 7.250 - 7.300     pCO2, Ven 52.9 (*) 45.0 - 50.0 (mmHg)    pO2, Ven 18.0 (*) 30.0 - 45.0 (mmHg)    Bicarbonate 28.2 (*) 20.0 - 24.0 (mEq/L)    TCO2 30  0 - 100 (mmol/L)    O2 Saturation 22.0      Acid-Base Excess 1.0  0.0 - 2.0 (mmol/L)    Sample type VENOUS     LIPID PANEL     Status: Abnormal   Collection Time   04/24/12 12:21 AM      Component Value Range Comment   Cholesterol 196  0 - 200 (mg/dL)    Triglycerides 86  <161 (mg/dL)    HDL 66  >09 (mg/dL)    Total CHOL/HDL Ratio 3.0      VLDL 17  0 - 40 (mg/dL)    LDL Cholesterol 604 (*) 0 - 99 (mg/dL)   CARDIAC PANEL(CRET KIN+CKTOT+MB+TROPI)     Status: Abnormal   Collection Time   04/24/12  1:37 AM      Component Value Range Comment   Total CK 124  7 - 177 (U/L)    CK, MB 4.6 (*) 0.3 - 4.0 (ng/mL)    Troponin I <0.30  <0.30 (ng/mL)    Relative Index 3.7 (*) 0.0 - 2.5      Dg Chest 2 View  04/23/2012  *RADIOLOGY REPORT*  Clinical Data: Chest pain.  CHEST - 2 VIEW  Comparison: 10/19/2009  Findings: Hyperexpansion is consistent with emphysema. The lungs are clear without focal infiltrate, edema, pneumothorax or pleural effusion. The cardiopericardial silhouette is within normal limits for size. Bones are diffusely demineralized.  IMPRESSION: Stable.  Emphysema without acute cardiopulmonary process.  Original Report Authenticated By: ERIC A. MANSELL, M.D.    Review of Systems  Constitutional: Negative.   HENT: Negative.   Eyes: Negative.   Respiratory: Negative.   Cardiovascular: Positive for chest pain.  Gastrointestinal: Negative.   Genitourinary: Negative.   Musculoskeletal: Negative.   Skin: Negative.   Neurological: Negative.   Endo/Heme/Allergies: Negative.   Psychiatric/Behavioral: Positive for memory loss.    Blood pressure 140/69, pulse 65, temperature 98 F (36.7 C), temperature source Oral, resp. rate 18, height 5\' 9"  (1.753 m), weight 51.4 kg (113 lb 5.1 oz), SpO2 96.00%. Physical Exam  Constitutional: She is oriented to person, place, and time. She appears well-developed and well-nourished.  HENT:  Head: Normocephalic and atraumatic.  Right Ear: External ear normal.  Left Ear: External ear normal.  Nose: Nose normal.  Mouth/Throat: Oropharynx is clear and moist.  Eyes: Conjunctivae and EOM are normal. Pupils are equal, round, and reactive to light.  Neck: Normal range of motion. Neck supple.  Cardiovascular: Normal rate, regular rhythm, normal heart sounds and intact distal pulses.   Respiratory: Effort normal and breath sounds normal.  GI: Soft. Bowel sounds are normal.  Musculoskeletal: Normal range of motion.  Neurological: She is alert and oriented to  person, place, and time. She has normal reflexes.  Skin: Skin is warm and dry.  Psychiatric: She has a normal mood and affect. Her behavior is normal. Judgment and thought content  normal.     Assessment/Plan And 78 year old female with risk factors for coronary artery disease presenting with exertional chest pain probably angina. Also renal insufficiency which could be acute versus chronic. His probably acute since he has no other signs of chronic kidney disease including anemia. Patient however smokes cigarette which may be responsible for her not having much of anemia and instead has high hemoglobin. Plan #1 chest pain: We will treat this as angina which is stable. Patient will get serial cardiac enzymes checked repeat EKG in the morning echocardiogram. If all lab work is negative she may still benefit from some type of stress testing. We will continue antihypertensives as necessary. #2 hypertension: Patient was reportedly on a diuretic at home. Her renal function is noted coxa valga with diuretics and ACE inhibitors. Her heart rate is also marginal so we will not use any beta blockers or calcium channel blockers. We will use hydralazine and clonidine as needed for her blood pressure as well as nitroglycerin PRN #3 dementia: This seems to be mild. She could not tolerate Aricept but patient is able to communicate freely without sign of major deformity #4 renal insufficiency: This could be acute versus chronic. We will hydrate the patient and follow renal function. If it does not improve this may mean that it is chronic. Check renal ultrasound to see if whether this is acute or chronic or due to obstruction. #5 hypothyroidism: Patient is on hormone replacement we will get her home dose and continue Synthroid. Check TSH level.   Sharayah Renfrow,LAWAL 04/24/2012, 5:35 AM

## 2012-04-24 NOTE — Progress Notes (Signed)
Utilization review complete 

## 2012-04-24 NOTE — Progress Notes (Signed)
  Echocardiogram 2D Echocardiogram has been performed.  [name] 04/24/2012, 9:10 AM

## 2012-04-24 NOTE — Care Management Note (Unsigned)
    Page 1 of 2   04/26/2012     10:59:23 AM   CARE MANAGEMENT NOTE 04/26/2012  Patient:  [name], [name]   Account Number:  [id number]  Date Initiated:  04/24/2012  Documentation initiated by:  AMERSON,JULIE  Subjective/Objective Assessment:   PT ADM WITH CHEST PAIN X 3 DAYS.  PTA, PT FAIRLY INDEPENDENT, LIVES ALONE, THOUGH SHE HAS DEMENTIA.     Action/Plan:   MET WITH PT AND NEPHEW TO DISCUSS DC PLANS.  NEPHEW STATES HE AND HIS WIFE LIVE APPROX 1 MILE FROM PT, AND ASSIST WITH HER CARE DAILY.   Anticipated DC Date:  04/27/2012   Anticipated DC Plan:  SKILLED NURSING FACILITY  In-house referral  Clinical Social Worker      DC Planning Services  CM consult      Choice offered to / List presented to:             Status of service:  In process, will continue to follow Medicare Important Message given?   (If response is "NO", the following Medicare IM given date fields will be blank) Date Medicare IM given:   Date Additional Medicare IM given:    Discharge Disposition:    Per UR Regulation:    If discussed at Long Length of Stay Meetings, dates discussed:    Comments:  04/26/12  1052  Sou Nohr SIMMONS RN, BSN [phone] RECEIVED CALL FROM PT'S NEPHEW, [name]- HE STATED THAT HE HAD CHANGED HIS MIND REGARDING DISCHARGE DISPOSITION; [name] FEELS THE SAFEST D/C DISPO IS SNF AT THIS POINT GIVEN HER DEMENTIA; DR. Janee Morn SPOKE WITH HIM AND CONFIRMED. CONSULT PLACED TO CSW.   04/24/12 JULIE AMERSON,RN,BSN 1100 NEPHEW STATES HE CURRENTLY HANDLES ALL OF PT'S FINANCIAL BUSINESS, AND HIS WIFE TAKES PT TO THE GROCERY STORE.  PT ABLE TO PERFORM SELF CARE, IE-SHOWER, SHAMPOO HAIR, ETC. NEPHEW STATES THEY ARE IN PROCESS OF GETTING PT SIGNED UP FOR MEDICAID BENEFITS.  HE DENIES ANY NEEDS FOR PT PRESENTLY.  NEPHEW'S DAUGHTER IS A NURSE FOR [employer], AND STATES SHE IS ABLE TO GET PT SET UP WITH HOME CARE IF AND WHEN IT IS NEEDED.

## 2012-04-24 NOTE — Progress Notes (Signed)
Need pt med list. Nephew stated that he would bring in tomorrow along with advance directive. Harmon Pier

## 2012-04-25 DIAGNOSIS — N19 Unspecified kidney failure: Secondary | ICD-10-CM

## 2012-04-25 DIAGNOSIS — I10 Essential (primary) hypertension: Secondary | ICD-10-CM

## 2012-04-25 DIAGNOSIS — E032 Hypothyroidism due to medicaments and other exogenous substances: Secondary | ICD-10-CM

## 2012-04-25 DIAGNOSIS — R079 Chest pain, unspecified: Secondary | ICD-10-CM

## 2012-04-25 NOTE — Progress Notes (Signed)
Subjective: Patient denies any chest pain, no SOB. Patient cant remember her MD name or her nephew's name when asked at the time.   Objective: Vital signs in last 24 hours: Filed Vitals:   04/24/12 0505 04/24/12 1358 04/24/12 1919 04/25/12 0429  BP: 140/69 157/69 131/68 156/72  Pulse: 65 62 62 64  Temp: 98 F (36.7 C) 97.7 F (36.5 C) 98.4 F (36.9 C) 98.5 F (36.9 C)  TempSrc: Oral Oral Oral Oral  Resp: 18 18 16 16   Height:      Weight:      SpO2: 96% 98% 98% 96%    Intake/Output Summary (Last 24 hours) at 04/25/12 1038 Last data filed at 04/25/12 0718  Gross per 24 hour  Intake    343 ml  Output   2300 ml  Net  -1957 ml    Weight change:   General: Alert, awake, oriented x3, in no acute distress. HEENT: No bruits, no goiter. Heart: Regular rate and rhythm, without murmurs, rubs, gallops. Lungs: Clear to auscultation bilaterally. Abdomen: Soft, nontender, nondistended, positive bowel sounds. Extremities: No clubbing cyanosis or edema with positive pedal pulses. Neuro: Grossly intact, nonfocal.    Lab Results:  [facility] 04/24/12 0550 04/23/12 2046  NA 141 137  K 4.2 4.7  CL 106 103  CO2 24 23  GLUCOSE 83 92  BUN 47* 55*  CREATININE 2.10* 2.33*  CALCIUM 8.9 9.6  MG -- --  PHOS -- --    Basename 04/24/12 0550  AST 17  ALT 15  ALKPHOS 38*  BILITOT 0.4  PROT 5.9*  ALBUMIN 3.5   No results found for this basename: LIPASE:2,AMYLASE:2 in the last 72 hours  Basename 04/24/12 0550 04/23/12 2046  WBC 7.9 7.6  NEUTROABS -- --  HGB 14.4 15.3*  HCT 41.7 43.4  MCV 94.1 93.5  PLT 186 209    Basename 04/24/12 1621 04/24/12 0928 04/24/12 0137  CKTOTAL 106 99 124  CKMB 4.2* 4.0 4.6*  CKMBINDEX -- -- --  TROPONINI <0.30 <0.30 <0.30   No components found with this basename: POCBNP:3 No results found for this basename: DDIMER:2 in the last 72 hours No results found for this basename: HGBA1C:2 in the last 72 hours  Basename 04/24/12 0021  CHOL 196  HDL  66  LDLCALC 113*  TRIG 86  CHOLHDL 3.0  LDLDIRECT --    Basename 04/24/12 0550  TSH 5.278*  T4TOTAL --  T3FREE --  THYROIDAB --   No results found for this basename: VITAMINB12:2,FOLATE:2,FERRITIN:2,TIBC:2,IRON:2,RETICCTPCT:2 in the last 72 hours  Micro Results: No results found for this or any previous visit (from the past 240 hour(s)).  Studies/Results: Dg Chest 2 View  04/23/2012  *RADIOLOGY REPORT*  Clinical Data: Chest pain.  CHEST - 2 VIEW  Comparison: 10/19/2009  Findings: Hyperexpansion is consistent with emphysema. The lungs are clear without focal infiltrate, edema, pneumothorax or pleural effusion. The cardiopericardial silhouette is within normal limits for size. Bones are diffusely demineralized.  IMPRESSION: Stable.  Emphysema without acute cardiopulmonary process.  Original Report Authenticated By: ERIC A. MANSELL, M.D.   US Renal  04/24/2012  *RADIOLOGY REPORT*  Clinical Data:  Renal failure  RENAL/URINARY TRACT ULTRASOUND COMPLETE  Comparison:  None  Findings:  Right Kidney:  Measures 10.7 cm  Normal in size and parenchymal echogenicity. Right pelvocaliectasis without evidence for hydronephrosis or mass.  Left Kidney:  Measures 8.4 cm. Smaller than the right.  Normal echogenicity.  Small stone within the inferior pole collecting system of  the left kidney measures 7 mm. No evidence of mass or hydronephrosis.  Bladder:  Appears normal for degree of bladder distention.  IMPRESSION: 1.  Right pelvocaliectasis. 2.  Left renal calculi without evidence for hydronephrosis.  Original Report Authenticated By: Rosealee Albee, M.D.    Medications:    . aspirin EC  81 mg Oral Daily  . docusate sodium  100 mg Oral BID  . enoxaparin  30 mg Subcutaneous Daily  . metoprolol tartrate  12.5 mg Oral BID  . sodium chloride  3 mL Intravenous Q12H    Assessment: Principal Problem:  *Chest pain Active Problems:  HTN (hypertension)  Dementia  Hypothyroidism  Renal  insufficiency   Plan: Chest pain (04/24/2012) -cardiac enzymes so far negative  -continue ASA, prn nitrates  2d echo negative -cardiology ff and no further workup recommended at this time.  Renal Insufficiency- acute vs chronic  Likely secondary to volume depletion from diuretic. Renal function slowly improving with hydration. -renal US with L. calculi but no hydronephrosis  -HCTZ dc'ed, continue hydration follow and recheck   HTN (hypertension) (04/24/2012)  -HCTZ dc'ed as above. Continue B-blocker given #1 above, follow and titrate as appropriate   Hypothyroidism (04/24/2012)  -resume synthroid.TSH slightly elevated at 5278, f/u as outpatient.   Dementia     LOS: 2 days   [facility] 04/25/2012, 10:38 AM

## 2012-04-25 NOTE — Progress Notes (Signed)
Subjective:  Up in room, she denies chest pain.  Objective:  Vital Signs in the last 24 hours: Temp:  [97.7 F (36.5 C)-98.5 F (36.9 C)] 98.5 F (36.9 C) (06/05 0429) Pulse Rate:  [62-64] 64  (06/05 0429) Resp:  [16-18] 16  (06/05 0429) BP: (131-157)/(68-72) 156/72 mmHg (06/05 0429) SpO2:  [96 %-98 %] 96 % (06/05 0429)  Intake/Output from previous day:  Intake/Output Summary (Last 24 hours) at 04/25/12 1005 Last data filed at 04/25/12 0718  Gross per 24 hour  Intake    343 ml  Output   2300 ml  Net  -1957 ml    Physical Exam: General appearance: alert, cooperative and no distress Lungs: clear to auscultation bilaterally Heart: regular rate and rhythm   Rate: 66  Rhythm: normal sinus rhythm  Lab Results:  Basename 04/24/12 0550 04/23/12 2046  WBC 7.9 7.6  HGB 14.4 15.3*  PLT 186 209    Basename 04/24/12 0550 04/23/12 2046  NA 141 137  K 4.2 4.7  CL 106 103  CO2 24 23  GLUCOSE 83 92  BUN 47* 55*  CREATININE 2.10* 2.33*    Basename 04/24/12 1621 04/24/12 0928  TROPONINI <0.30 <0.30   Hepatic Function Panel  Basename 04/24/12 0550  PROT 5.9*  ALBUMIN 3.5  AST 17  ALT 15  ALKPHOS 38*  BILITOT 0.4  BILIDIR --  IBILI --    Basename 04/24/12 0021  CHOL 196   No results found for this basename: INR in the last 72 hours  Imaging: Imaging results have been reviewed  Cardiac Studies: 2D - Nl LVF, no WMA  Assessment/Plan:   Principal Problem:  *Chest pain Active Problems:  HTN (hypertension)  Dementia  Renal insufficiency  Hypothyroidism  Plan MI r/o, 2D normal, we will see as needed, she can follow up with her Primary MD.  Corine Shelter PA-C 04/25/2012, 10:05 AM   Patient seen and examined.  No further chest pain with negative biomarkers x 3 & normal Echo. Agree that if renal function improves after hydration and HCTZ withheld that she should be fine for discharge. Can f/u with PMD, and if desired by PMD - can be referred for OP ST & f/u  with Dr. Dorma Russell.  Thanks, Marykay Lex, M.D., M.S. [facility] [address] [place], [place]  [address]  [phone] Pager # [phone]  04/25/2012 6:25 PM

## 2012-04-26 DIAGNOSIS — N19 Unspecified kidney failure: Secondary | ICD-10-CM

## 2012-04-26 DIAGNOSIS — I10 Essential (primary) hypertension: Secondary | ICD-10-CM

## 2012-04-26 DIAGNOSIS — R079 Chest pain, unspecified: Secondary | ICD-10-CM

## 2012-04-26 DIAGNOSIS — E032 Hypothyroidism due to medicaments and other exogenous substances: Secondary | ICD-10-CM

## 2012-04-26 LAB — BASIC METABOLIC PANEL
Chloride: 111 mEq/L (ref 96–112)
Creatinine, Ser: 1.36 mg/dL — ABNORMAL HIGH (ref 0.50–1.10)
GFR calc Af Amer: 40 mL/min — ABNORMAL LOW (ref >90)
Sodium: 142 mEq/L (ref 135–145)

## 2012-04-26 MED ORDER — LORAZEPAM 2 MG/ML IJ SOLN
0.5000 mg | Freq: Two times a day (BID) | INTRAMUSCULAR | Status: DC | PRN
  Administered 2012-04-26: 0.5 mg via INTRAVENOUS
  Filled 2012-04-26: qty 1

## 2012-04-26 NOTE — Clinical Documentation Improvement (Signed)
MALNUTRITION DOCUMENTATION CLARIFICATION  THIS DOCUMENT IS NOT A PERMANENT PART OF THE MEDICAL RECORD  TO RESPOND TO THE THIS QUERY, FOLLOW THE INSTRUCTIONS BELOW:  1. If needed, update documentation for the patient's encounter via the notes activity.  2. Access this query again and click edit on the In [employer].  3. After updating, or not, click F2 to complete all highlighted (required) fields concerning your review. Select "additional documentation in the medical record" OR "no additional documentation provided".  4. Click Sign note button.  5. The deficiency will fall out of your In Basket *Please let us know if you are not able to complete this workflow by phone or e-mail (listed below).  Please update your documentation within the medical record to reflect your response to this query.                                                                                        04/26/12   Dear Dr.Thompson / Associates,  In a better effort to capture your patient's severity of illness, reflect appropriate length of stay and utilization of resources, a review of the patient medical record has revealed the following indicators.    Based on your clinical judgment, please clarify and document in a progress note and/or discharge summary the clinical condition associated with the following supporting information:  In responding to this query please exercise your independent judgment.  The fact that a query is asked, does not imply that any particular answer is desired or expected.  Possible Clinical Conditions?  _______Moderate Malnutrition _______Severe Malnutrition   _______Protein Calorie Malnutrition _______Severe Protein Calorie Malnutrition _______Underweight  _______Cachexia   _______Other Condition _______Cannot clinically determine    Supporting Information: Risk Factors: Underweight related to inadequate energy intake, per RD's 6/4 evaluation.  Signs & Symptoms: Ht: 5'9"      Wt:51.4kg  BMI: 16.73  Treatment  Medications:  Ensure complete 2x daily.  Nutrition Consult: 04/24/12.   You may use possible, probable, or suspect with inpatient documentation. possible, probable, suspected diagnoses MUST be documented at the time of discharge  Reviewed:  No additional documentation provided.                                                         Thank You,  Marciano Sequin,  Clinical Documentation Specialist:  Pager: [phone]  Health Information Management [place]

## 2012-04-26 NOTE — Clinical Social Work Placement (Signed)
     Clinical Social Work Department CLINICAL SOCIAL WORK PLACEMENT NOTE 04/27/2012  Patient:  [name], [name]  Account Number:  [id number] Admit date:  04/23/2012  Clinical Social Worker:  Robin Searing  Date/time:  04/26/2012 10:32 AM  Clinical Social Work is seeking post-discharge placement for this patient at the following level of care:   SKILLED NURSING   (*CSW will update this form in Epic as items are completed)   04/26/2012  Patient/family provided with [facility] Department of Clinical Social Works list of facilities offering this level of care within the geographic area requested by the patient (or if unable, by the patients family).  04/26/2012  Patient/family informed of their freedom to choose among providers that offer the needed level of care, that participate in Medicare, Medicaid or managed care program needed by the patient, have an available bed and are willing to accept the patient.  04/26/2012  Patient/family informed of [facility] ownership interest in [facility], as well as of the fact that they are under no obligation to receive care at this facility.  PASARR submitted to EDS on 04/26/2012 PASARR number received from EDS on 04/27/2012  FL2 transmitted to all facilities in geographic area requested by pt/family on  04/26/2012 FL2 transmitted to all facilities within larger geographic area on   Patient informed that his/her managed care company has contracts with or will negotiate with  certain facilities, including the following:     Patient/family informed of bed offers received:  04/27/2012 Patient chooses bed at [facility] Physician recommends and patient chooses bed at    Patient to be transferred to [facility] on  04/27/2012 Patient to be transferred to facility by EMS  The following physician request were entered in Epic:   Additional Comments:

## 2012-04-26 NOTE — Evaluation (Signed)
Occupational Therapy Evaluation Patient Details Name: [name] MRN: [id number] DOB: 1927/11/21 Today's Date: 04/26/2012 Time:  -     OT Assessment / Plan / Recommendation Clinical Impression  Pt is an 78 yo female who presents w/CP who has significant dementia. Feel it would be unsafe for pt to return home without 24/7 A 2*impaired cognition. Skilled OT recommended to maximize I w/BADLs to mod I level in prep for safe d/c home with HHOT.    OT Assessment  Patient needs continued OT Services    Follow Up Recommendations  Home health OT;Supervision/Assistance - 24 hour;Skilled nursing facility    Barriers to Discharge Decreased caregiver support    Equipment Recommendations  Other (comment) (TBD)    Recommendations for Other Services    Frequency  Min 2X/week    Precautions / Restrictions Precautions Precautions: Fall   Pertinent Vitals/Pain     ADL  Grooming: Performed;Wash/dry hands;Supervision/safety Where Assessed - Grooming: Unsupported standing Upper Body Bathing: Simulated;Set up Where Assessed - Upper Body Bathing: Unsupported sitting Lower Body Bathing: Simulated;Set up Where Assessed - Lower Body Bathing: Unsupported sit to stand Upper Body Dressing: Simulated;Set up Where Assessed - Upper Body Dressing: Unsupported sitting Lower Body Dressing: Simulated;Set up Where Assessed - Lower Body Dressing: Unsupported sit to stand Toilet Transfer: Performed;Supervision/safety Toilet Transfer Method: Sit to Barista: Regular height toilet Toileting - Clothing Manipulation and Hygiene: Simulated;Supervision/safety Where Assessed - Toileting Clothing Manipulation and Hygiene: Sit to stand from 3-in-1 or toilet Equipment Used: Gait belt ADL Comments: Pt mobilizes well. Did have 1 LOB upon initially standing that she was unable to self correct.     OT Diagnosis: Generalized weakness  OT Problem List: Impaired balance (sitting and/or  standing);Decreased activity tolerance;Decreased knowledge of use of DME or AE;Decreased cognition OT Treatment Interventions: Self-care/ADL training;DME and/or AE instruction;Therapeutic activities;Patient/family education;Balance training   OT Goals Acute Rehab OT Goals OT Goal Formulation: Patient unable to participate in goal setting Time For Goal Achievement: 05/10/12 Potential to Achieve Goals: Fair ADL Goals Pt Will Perform Grooming: with modified independence;Standing at sink ADL Goal: Grooming - Progress: Goal set today Pt Will Perform Lower Body Bathing: with modified independence;Sit to stand from chair;Sit to stand from bed ADL Goal: Lower Body Bathing - Progress: Goal set today Pt Will Perform Lower Body Dressing: with modified independence;Sit to stand from bed;Sit to stand from chair ADL Goal: Lower Body Dressing - Progress: Goal set today Pt Will Transfer to Toilet: with modified independence;Ambulation;Regular height toilet ADL Goal: Toilet Transfer - Progress: Goal set today Pt Will Perform Toileting - Clothing Manipulation: with modified independence;Standing;Sitting on 3-in-1 or toilet ADL Goal: Toileting - Clothing Manipulation - Progress: Goal set today Pt Will Perform Toileting - Hygiene: with modified independence;Sitting on 3-in-1 or toilet;Sit to stand from 3-in-1/toilet ADL Goal: Toileting - Hygiene - Progress: Goal set today Pt Will Perform Tub/Shower Transfer: Tub transfer;with modified independence;Ambulation ADL Goal: Tub/Shower Transfer - Progress: Goal set today Miscellaneous OT Goals Miscellaneous OT Goal #1: Pt will complete all self care with a min amt of VCs to initiate, stay on task and followthrough. OT Goal: Miscellaneous Goal #1 - Progress: Goal set today  Visit Information  Last OT Received On: 04/26/12 Assistance Needed: +1    Subjective Data  Subjective: I dont know where I am. Patient Stated Goal: Pt unable to state.   Prior  Functioning  Home Living Lives With: Other (Comment) (per chart, pt lives alone but pt stating cousin.) Available  Help at Discharge: Family;Available PRN/intermittently Type of Home: House Home Access: Stairs to enter [employer] of Steps: 4 Entrance Stairs-Rails: Can reach both Home Layout: One level Bathroom Shower/Tub: Engineer, manufacturing systems: Standard Home Adaptive Equipment: Walker - standard Additional Comments: Unsure of reliability. Pt is extremely confused. Prior Function Level of Independence: Independent Driving: No Vocation: Retired Comments: Unsure of reliability. Pt extremely confused. Communication Communication: No difficulties    Cognition  Overall Cognitive Status: No family/caregiver present to determine baseline cognitive functioning Area of Impairment: Attention;Memory;Following commands;Safety/judgement;Awareness of errors;Awareness of deficits;Problem solving;Executive functioning Arousal/Alertness: Awake/alert Orientation Level: Disoriented to;Place;Time;Situation Behavior During Session: [facility] for tasks performed Current Attention Level: Focused (but only for a few seconds.) Memory: Decreased recall of precautions Memory Deficits: Pt's STM span only ~1 minute. Following Commands: Follows one step commands inconsistently;Follows one step commands with increased time Safety/Judgement: Decreased awareness of safety precautions;Decreased safety judgement for tasks assessed Awareness of Errors: Assistance required to identify errors made;Assistance required to correct errors made Cognition - Other Comments: Pt also distractible but easily redirected.    Extremity/Trunk Assessment Right Upper Extremity Assessment RUE ROM/Strength/Tone: Within functional levels Left Upper Extremity Assessment LUE ROM/Strength/Tone: Within functional levels   Mobility Bed Mobility Bed Mobility: Supine to Sit Supine to Sit: 6: Modified independent  (Device/Increase time) Transfers Transfers: Sit to Stand;Stand to Sit Sit to Stand: 4: Min guard;From bed;From toilet Stand to Sit: 5: Supervision;With upper extremity assist;To chair/3-in-1;To toilet Details for Transfer Assistance: min VCs for hand placement, supervision for safety.   Exercise    Balance Balance Balance Assessed: Yes Static Sitting Balance Static Sitting - Balance Support: No upper extremity supported;Feet supported Static Sitting - Level of Assistance: 6: Modified independent (Device/Increase time) Static Standing Balance Static Standing - Balance Support: No upper extremity supported;During functional activity Static Standing - Level of Assistance: 5: Stand by assistance  End of Session OT - End of Session Equipment Utilized During Treatment: Gait belt Activity Tolerance: Patient tolerated treatment well Patient left: in chair;with call bell/phone within reach   Nazaire Cordial A OTR/L [phone] 04/26/2012, 3:45 PM

## 2012-04-26 NOTE — Progress Notes (Signed)
Subjective: Patient denies any chest pain, no SOB. No complaints. Patient wants to go home.  Objective: Vital signs in last 24 hours: Filed Vitals:   04/25/12 1346 04/25/12 1432 04/25/12 1939 04/26/12 0434  BP: 93/50 120/59 149/78 158/72  Pulse: 57  57 50  Temp: 98.2 F (36.8 C)  98.1 F (36.7 C) 97.9 F (36.6 C)  TempSrc: Oral  Oral Oral  Resp: 17  16 16   Height:      Weight:      SpO2: 97%  98% 99%    Intake/Output Summary (Last 24 hours) at 04/26/12 0934 Last data filed at 04/26/12 0900  Gross per 24 hour  Intake    483 ml  Output      0 ml  Net    483 ml    Weight change:   General: Alert, awake, oriented x3, in no acute distress. HEENT: No bruits, no goiter. Heart: Regular rate and rhythm, without murmurs, rubs, gallops. Lungs: Clear to auscultation bilaterally. Abdomen: Soft, nontender, nondistended, positive bowel sounds. Extremities: No clubbing cyanosis or edema with positive pedal pulses. Neuro: Grossly intact, nonfocal.    Lab Results:  Basename 04/26/12 0544 04/24/12 0550  NA 142 141  K 4.5 4.2  CL 111 106  CO2 23 24  GLUCOSE 79 83  BUN 25* 47*  CREATININE 1.36* 2.10*  CALCIUM 8.4 8.9  MG -- --  PHOS -- --    Basename 04/24/12 0550  AST 17  ALT 15  ALKPHOS 38*  BILITOT 0.4  PROT 5.9*  ALBUMIN 3.5   No results found for this basename: LIPASE:2,AMYLASE:2 in the last 72 hours  Basename 04/24/12 0550 04/23/12 2046  WBC 7.9 7.6  NEUTROABS -- --  HGB 14.4 15.3*  HCT 41.7 43.4  MCV 94.1 93.5  PLT 186 209    Basename 04/24/12 1621 04/24/12 0928 04/24/12 0137  CKTOTAL 106 99 124  CKMB 4.2* 4.0 4.6*  CKMBINDEX -- -- --  TROPONINI <0.30 <0.30 <0.30   No components found with this basename: POCBNP:3 No results found for this basename: DDIMER:2 in the last 72 hours No results found for this basename: HGBA1C:2 in the last 72 hours  Basename 04/24/12 0021  CHOL 196  HDL 66  LDLCALC 113*  TRIG 86  CHOLHDL 3.0  LDLDIRECT --     Basename 04/24/12 0550  TSH 5.278*  T4TOTAL --  T3FREE --  THYROIDAB --   No results found for this basename: VITAMINB12:2,FOLATE:2,FERRITIN:2,TIBC:2,IRON:2,RETICCTPCT:2 in the last 72 hours  Micro Results: No results found for this or any previous visit (from the past 240 hour(s)).  Studies/Results: No results found.  Medications:    . aspirin EC  81 mg Oral Daily  . docusate sodium  100 mg Oral BID  . enoxaparin  30 mg Subcutaneous Daily  . metoprolol tartrate  12.5 mg Oral BID  . sodium chloride  3 mL Intravenous Q12H    Assessment: Principal Problem:  *Chest pain Active Problems:  HTN (hypertension)  Dementia  Hypothyroidism  Renal insufficiency   Plan: Chest pain (04/24/2012) -cardiac enzymes so far negative  -continue ASA, prn nitrates  2d echo negative -cardiology ff and no further workup recommended at this time. Per cardiology patient to follow up with PCP and if desired per PCP may be referred for outpatient stress test and followup with Dr. Gery Pray.  Renal Insufficiency- acute vs chronic Likely secondary to volume depletion from diuretic. Renal function slowly improving with hydration. -renal US with L. calculi  but no hydronephrosis  -HCTZ dc'ed, continue hydration follow. Will not recommend resuming HCTZ.  HTN (hypertension) (04/24/2012)  -HCTZ dc'ed as above. Continue B-blocker given #1 above, follow and titrate as appropriate   Hypothyroidism (04/24/2012)  -resume synthroid.TSH slightly elevated at 5278, f/u as outpatient.   Dementia  Disposition Patient with a history of dementia. Patient currently lives alone. Likely unsafe for patient to return to her current living situation. Yesterday  initially family was adamant on patient returning home with close supervision however on further discussion with nephew who is the POA he felt that it would be safer for patient to go to a skilled nursing facility. Will inform Child psychotherapist. This was discussed  with the case manager. Will need placement.      LOS: 3 days   [name] [phone] 04/26/2012, 9:34 AM

## 2012-04-26 NOTE — Clinical Social Work Psychosocial (Signed)
     Clinical Social Work Department BRIEF PSYCHOSOCIAL ASSESSMENT 04/26/2012  Patient:  [name], [name]     Account Number:  [id number]     Admit date:  04/23/2012  Clinical Social Worker:  Robin Searing  Date/Time:  04/25/2012 02:33 PM  Referred by:  Care Management  Date Referred:  04/25/2012 Referred for  Psychosocial assessment   Other Referral:   Interview type:  Family Other interview type:   NEPHEW/POA- [name] [phone]    PSYCHOSOCIAL DATA Living Status:  ALONE Admitted from facility:   Level of care:   Primary support name:  [name] [phone] Primary support relationship to patient:  FAMILY Degree of support available:    CURRENT CONCERNS Current Concerns  Post-Acute Placement   Other Concerns:    SOCIAL WORK ASSESSMENT / PLAN Nephew states they have recently taken patient's car because of gradual signs of memory issues, etc.  She has been living alone- he lives about 5 minutes away and checks on her 3 times daily-  Nephew's daughter works for [employer]- he plans to take her home at d/c and keep a closer watch on her-  "I don't want to completely rob her of her independence and life"- he wants to transition her to possible ALF in the future but wants to "take baby steps" so not to upset her. Discussed HH services and he is agreeable to this- states his daughter works with [employer] so he would want to use them for the [facility] services- would recommend [facility] RN, PT, OT, aide and SW   Assessment/plan status:  Other - See comment Other assessment/ plan:   Information/referral to community resources:   Will ask for [name] SW to follow up for ?placement from home down the road.    PATIENTS/FAMILYS RESPONSE TO PLAN OF CARE: Nephew agreeable to this plan and is hopeful she will improve cognitively at home where she is in her familiar surroundings-

## 2012-04-26 NOTE — Clinical Documentation Improvement (Signed)
RENAL FAILURE DOCUMENTATION CLARIFICATION QUERY  THIS DOCUMENT IS NOT A PERMANENT PART OF THE MEDICAL RECORD  TO RESPOND TO THE THIS QUERY, FOLLOW THE INSTRUCTIONS BELOW:  1. If needed, update documentation for the patient's encounter via the notes activity.  2. Access this query again and click edit on the In [employer].  3. After updating, or not, click F2 to complete all highlighted (required) fields concerning your review. Select "additional documentation in the medical record" OR "no additional documentation provided".  4. Click Sign note button.  5. The deficiency will fall out of your In Basket *Please let us know if you are not able to complete this workflow by phone or e-mail (listed below).  Please update your documentation within the medical record to reflect your response to this query.                                                                                    04/26/12  Dear Dr.Thompson  In a better effort to capture your patient's severity of illness, reflect appropriate length of stay and utilization of resources, a review of the patient medical record has revealed the following indicators.    Based on your clinical judgment, please clarify and document in a progress note and/or discharge summary the clinical condition associated with the following supporting information:  In responding to this query please exercise your independent judgment.  The fact that a query is asked, does not imply that any particular answer is desired or expected.  Possible Clinical Conditions?   _______Acute Renal Failure _______Acute Kidney Injury _______Acute on Chronic Renal Failure _______Chronic Renal Failure _______Acute Renal Insufficiency _______Other Condition _______Cannot Clinically Determine   Supporting Information:  Risk Factors: Renal Insufficiency, acute vs chronic noted per 6/5 progress notes. Renal Failure noted under clinical data on 6/4 renal U/S note.    Lab: Creatinine: 6/3:     2.33 6/4:     2.10 6/6:     1.36  BUN:       6/3:     55 6/4:     47 6/6:     25  GFR: 6/3:    18:non Af-Am.     21:Af.Am. 6/4:    21:non Af-Am.     24:Af.Am.   6/6:    35:non Af-Am.     40:Af.Am.                   You may use possible, probable, or suspect with inpatient documentation. possible, probable, suspected diagnoses MUST be documented at the time of discharge  Reviewed: Additional documentation provided per 6/7 progress notes.                                       Thank You,  Marciano Sequin,  Clinical Documentation Specialist:  Pager: [phone]  Health Information Management [place]

## 2012-04-26 NOTE — Clinical Documentation Improvement (Signed)
CHEST PAIN DOCUMENTATION CLARIFICATION QUERY  THIS DOCUMENT IS NOT A PERMANENT PART OF THE MEDICAL RECORD  TO RESPOND TO THE THIS QUERY, FOLLOW THE INSTRUCTIONS BELOW:  1. If needed, update documentation for the patient's encounter via the notes activity.  2. Access this query again and click edit on the In [employer].  3. After updating, or not, click F2 to complete all highlighted (required) fields concerning your review. Select "additional documentation in the medical record" OR "no additional documentation provided".  4. Click Sign note button.  5. The deficiency will fall out of your In Basket *Please let us know if you are not able to complete this workflow by phone or e-mail (listed below).          04/26/12  Dear Dr. Janee Morn Marton Redwood  In an effort to better capture your patient's severity of illness, reflect appropriate length of stay and utilization of resources, a review of the patient medical record has revealed the following indicators.    Based on your clinical judgment, please clarify and document in a progress note and/or discharge summary the clinical condition associated with the following supporting information:  In responding to this query please exercise your independent judgment.  The fact that a query is asked, does not imply that any particular answer is desired or expected.  Possible Clinical Conditions?   Based on your clinical judgment, please clarify cause of Chest Pain:  _______Unstable Angina  _______Musculoskeletal Chest Pain _______Pleuritic Chest Pain _______GERD _______Costochondritis _______Chest wall pain _______Other Condition _______Cannot Clinically Determine  Supporting Information: Patient admitted with c/o chest pain; work-up negative for MI.   You may use possible, probable, or suspect with inpatient documentation. possible, probable, suspected diagnoses MUST be documented at the time of discharge  Reviewed:   Additional documentation provided per 6/7 progress notes.                                                      Thank Patsy Lager  Clinical Documentation Specialist:  Pager: [phone]  Health Information Management [place]

## 2012-04-27 DIAGNOSIS — I10 Essential (primary) hypertension: Secondary | ICD-10-CM

## 2012-04-27 DIAGNOSIS — N19 Unspecified kidney failure: Secondary | ICD-10-CM

## 2012-04-27 DIAGNOSIS — R079 Chest pain, unspecified: Secondary | ICD-10-CM

## 2012-04-27 DIAGNOSIS — E032 Hypothyroidism due to medicaments and other exogenous substances: Secondary | ICD-10-CM

## 2012-04-27 LAB — BASIC METABOLIC PANEL
CO2: 21 mEq/L (ref 19–32)
Calcium: 8.7 mg/dL (ref 8.4–10.5)
GFR calc non Af Amer: 43 mL/min — ABNORMAL LOW (ref >90)
Potassium: 4.5 mEq/L (ref 3.5–5.1)
Sodium: 142 mEq/L (ref 135–145)

## 2012-04-27 MED ORDER — NITROGLYCERIN 0.4 MG SL SUBL: 0.4000 mg | SUBLINGUAL_TABLET | SUBLINGUAL | Status: DC | PRN

## 2012-04-27 MED ORDER — ENSURE COMPLETE PO LIQD: 237.0000 mL | Freq: Two times a day (BID) | ORAL | Status: DC | PRN

## 2012-04-27 MED ORDER — METOPROLOL SUCCINATE ER 25 MG PO TB24: 12.5000 mg | ORAL_TABLET | Freq: Every day | ORAL | Status: DC

## 2012-04-27 MED ORDER — ASPIRIN 81 MG PO TBEC: 81.0000 mg | DELAYED_RELEASE_TABLET | Freq: Every day | ORAL | Status: AC

## 2012-04-27 MED ORDER — LORAZEPAM 0.5 MG PO TABS: 0.5000 mg | ORAL_TABLET | Freq: Two times a day (BID) | ORAL | Status: AC | PRN

## 2012-04-27 NOTE — Discharge Summary (Signed)
Discharge Summary  [name] MR#: [id number]  DOB:01-07-1927  Date of Admission: 04/23/2012 Date of Discharge: 04/27/2012  Patient's PCP: No primary provider on file.  Attending Physician:Eulla Kochanowski  Consults:   #1: Dr. Dr. Christiane Ha Barry/Harding   Discharge Diagnoses: Chest pain Present on Admission:  .Chest pain .HTN (hypertension) .Dementia .Hypothyroidism .Renal failure .Renal insufficiency   Brief Admitting History and Physical An 78 year old female relatively healthy with known history of hypertension presenting with substernal chest pain on and off for 3 days. Patient has apparently been complaining to the family that she is having exertional chest pain over the weekend. It got worse today that she decided to come to the emergency room. She is no evidence 6 and never been in the hospital. She has mild dementia but is awake alert and oriented x3. She is also able to do her ADLs as she lives alone. Chest pain was rated as 4/5 central,no radiation. Chest pain was relieved before she came to the emergency room. She denied any nausea vomiting or diaphoresis. Patient has risk factors for coronary artery disease including her hypertension, hypothyroidism, renal insufficiency as well as unknown cholesterol status.  For the rest of admission history and physical please see H&P dictated by Dr. Mikeal Hawthorne   Discharge Medications Medication List  As of 04/27/2012 10:50 AM   START taking these medications         aspirin 81 MG EC tablet   Take 1 tablet (81 mg total) by mouth daily.      feeding supplement Liqd   Take 237 mLs by mouth 2 (two) times daily between meals as needed (suboptimal PO intake, underweight).      LORazepam 0.5 MG tablet   Commonly known as: ATIVAN   Take 1 tablet (0.5 mg total) by mouth 2 (two) times daily as needed for anxiety. As needed for agitation and anxiety.      metoprolol succinate 25 MG 24 hr tablet   Commonly known as: TOPROL-XL   Take 0.5 tablets  (12.5 mg total) by mouth daily.      nitroGLYCERIN 0.4 MG SL tablet   Commonly known as: NITROSTAT   Place 1 tablet (0.4 mg total) under the tongue every 5 (five) minutes x 3 doses as needed for chest pain.         CONTINUE taking these medications         CALCIUM 600 600 MG Tabs   Generic drug: calcium carbonate      levothyroxine 50 MCG tablet   Commonly known as: SYNTHROID, LEVOTHROID      multivitamin with minerals Tabs         STOP taking these medications         hydrochlorothiazide 25 MG tablet          Where to get your medications    These are the prescriptions that you need to pick up.   You may get these medications from any pharmacy.         LORazepam 0.5 MG tablet   metoprolol succinate 25 MG 24 hr tablet   nitroGLYCERIN 0.4 MG SL tablet         Information on where to get these meds is not yet available. Ask your nurse or doctor.         aspirin 81 MG EC tablet   feeding supplement [facility] Course: #1.Chest pain The patient had presented with chest pain and  was admitted to a telemetry floor. Cardiac enzymes were cycled which were negative. EKG which was done showed a normal sinus rhythm. A 2-D echo was obtained with an EF of 55-60%. There was no wall motion abnormalities. Patient was placed on aspirin and a beta blocker. Cardiology consultation was obtained and patient was seen in consultation by Dr. Nanetta [facility] of [facility]. On consultation patient was chest pain-free. Patient had no prior cardiac history. Enzymes were negative. An EKG had normal sinus rhythm. As 2-D echo was normal it was felt that no further cardiac workup was needed per cardiology. Patient did not have any further chest pain during the hospitalization on be discharged home in stable and improved condition. Per cardiology patient will need to followup with PCP and if recommended per PCP or desire to her PCP and a followup with Dr. Nanetta Batty as  outpatient. Patient be discharged in stable condition on aspirin and a beta blocker.  #2 acute renal failure On admission patient was noted to be in acute renal failure with a creatinine of 2.33. Patient also had a BUN of 55. Patient on admission was noted to be on a diuretic, HCTZ. It was felt patient's acute renal failure was prerenal azotemia secondary to dehydration and volume depletion in the setting of her diuretics. Patient's diuretic was discontinued. Patient was hydrated gently with IV fluids with improvement in her renal function. Patient's renal function improved on a daily basis such that by day of discharge the creatinine was down to 1.14. Patient will be discharged off of her diuretic. Patient was will need a repeat basic metabolic profile done in about a week to followup on her renal function. Patient be discharged in stable and improved condition.  #3 hypertension On admission patient was noted to be in acute renal failure and a such had HCTZ was discontinued. Patient was started on a low-dose beta blocker for better blood pressure control. Patient will be discharged home on Toprol-XL 12.5 mg daily and will need followup with M.D. at the skilled nursing facility. Patient's Toprol may be adjusted as tolerated by her pulse if further blood pressure control is needed. Patient be discharged in stable condition.  The rest of patient's chronic medical issues remained stable throughout the hospitalization and patient be discharged in stable and improved condition.   Present on Admission:  .Chest pain .HTN (hypertension) .Dementia .Hypothyroidism .Renal failure .Renal insufficiency   Day of Discharge BP 154/73  Pulse 41  Temp(Src) 97.4 F (36.3 C) (Oral)  Resp 20  Ht 5\' 9"  (1.753 m)  Wt 51.4 kg (113 lb 5.1 oz)  BMI 16.73 kg/m2  SpO2 100% Subjective: No chest pain no complaints General: Alert, awake, oriented x3, in no acute distress. HEENT: No bruits, no goiter. Heart: Regular  rate and rhythm, without murmurs, rubs, gallops. Lungs: Clear to auscultation bilaterally. Abdomen: Soft, nontender, nondistended, positive bowel sounds. Extremities: No clubbing cyanosis or edema with positive pedal pulses. Neuro: Grossly intact, nonfocal.   Results for orders placed during the hospital encounter of 04/23/12 (from the past 48 hour(s))  BASIC METABOLIC PANEL     Status: Abnormal   Collection Time   04/26/12  5:44 AM      Component Value Range Comment   Sodium 142  135 - 145 (mEq/L)    Potassium 4.5  3.5 - 5.1 (mEq/L)    Chloride 111  96 - 112 (mEq/L)    CO2 23  19 - 32 (mEq/L)  Glucose, Bld 79  70 - 99 (mg/dL)    BUN 25 (*) 6 - 23 (mg/dL) DELTA CHECK NOTED   Creatinine, Ser 1.36 (*) 0.50 - 1.10 (mg/dL) DELTA CHECK NOTED   Calcium 8.4  8.4 - 10.5 (mg/dL)    GFR calc non Af Amer 35 (*) >90 (mL/min)    GFR calc Af Amer 40 (*) >90 (mL/min)   BASIC METABOLIC PANEL     Status: Abnormal   Collection Time   04/27/12  6:07 AM      Component Value Range Comment   Sodium 142  135 - 145 (mEq/L)    Potassium 4.5  3.5 - 5.1 (mEq/L)    Chloride 111  96 - 112 (mEq/L)    CO2 21  19 - 32 (mEq/L)    Glucose, Bld 71  70 - 99 (mg/dL)    BUN 20  6 - 23 (mg/dL)    Creatinine, Ser 1.61 (*) 0.50 - 1.10 (mg/dL)    Calcium 8.7  8.4 - 10.5 (mg/dL)    GFR calc non Af Amer 43 (*) >90 (mL/min)    GFR calc Af Amer 50 (*) >90 (mL/min)     Dg Chest 2 View  04/23/2012  *RADIOLOGY REPORT*  Clinical Data: Chest pain.  CHEST - 2 VIEW  Comparison: 10/19/2009  Findings: Hyperexpansion is consistent with emphysema. The lungs are clear without focal infiltrate, edema, pneumothorax or pleural effusion. The cardiopericardial silhouette is within normal limits for size. Bones are diffusely demineralized.  IMPRESSION: Stable.  Emphysema without acute cardiopulmonary process.  Original Report Authenticated By: ERIC A. MANSELL, M.D.   US Renal  04/24/2012  *RADIOLOGY REPORT*  Clinical Data:  Renal failure   RENAL/URINARY TRACT ULTRASOUND COMPLETE  Comparison:  None  Findings:  Right Kidney:  Measures 10.7 cm  Normal in size and parenchymal echogenicity. Right pelvocaliectasis without evidence for hydronephrosis or mass.  Left Kidney:  Measures 8.4 cm. Smaller than the right.  Normal echogenicity.  Small stone within the inferior pole collecting system of the left kidney measures 7 mm. No evidence of mass or hydronephrosis.  Bladder:  Appears normal for degree of bladder distention.  IMPRESSION: 1.  Right pelvocaliectasis. 2.  Left renal calculi without evidence for hydronephrosis.  Original Report Authenticated By: Rosealee Albee, M.D.     Disposition: Skilled nursing facility  Diet: Mechanical soft diet  Activity: Increase activity slowly.   Follow-up Appts: Discharge Orders    Future Orders Please Complete By Expires   Diet general      Comments:   Mechanical soft   Increase activity slowly      Discharge instructions      Comments:   Follow up with MD at SNF.       TESTS THAT NEED FOLLOW-UP  BMET IN 1 WEEK TO FOLLOW UP ON RENAL FUNCTION.  Time spent on discharge, talking to the patient, and coordinating care: 50 mins.   Signed: Genna Casimir 04/27/2012, 10:50 AM

## 2012-04-27 NOTE — Evaluation (Signed)
Physical Therapy Evaluation Patient Details Name: [name] MRN: [id number] DOB: 11-Sep-1927 Today's Date: 04/27/2012 Time: 4098-1191 PT Time Calculation (min): 18 min  PT Assessment / Plan / Recommendation Clinical Impression  Pt admitted with CP without current pain but demonstrates significant cognitive deficits and decreased safety awareness. Pt needs min assist with ambulation to prevent falls and requires 24hr supervision for safety. If family able to supervise then home with family but otherwise SNF recommended. Will follow acutely to maximize balance and activity.    PT Assessment  Patient needs continued PT services    Follow Up Recommendations  Skilled nursing facility    Barriers to Discharge Decreased caregiver support      lEquipment Recommendations  Defer to next venue    Recommendations for Other Services     Frequency Min 2X/week    Precautions / Restrictions Precautions Precautions: Fall   Pertinent Vitals/Pain No pain      Mobility  Bed Mobility Bed Mobility: Supine to Sit Supine to Sit: 6: Modified independent (Device/Increase time);With rails Transfers Transfers: Sit to Stand;Stand to Sit Sit to Stand: 5: Supervision;From bed Stand to Sit: 5: Supervision;To chair/3-in-1 Details for Transfer Assistance: cueing for safety Ambulation/Gait Ambulation/Gait Assistance: 4: Min assist Ambulation Distance (Feet): 300 Feet Assistive device: None Ambulation/Gait Assistance Details: Pt with decreased stride and speed maintaining arms crossed throughout ambulation and 3 periods of LOB requiring assist to correct to prevent fall Gait Pattern: Decreased stride length Gait velocity: 66ft/28sec= 1.108ft/sec placing pt at increased fall risk Stairs: Yes Stairs Assistance: 5: Supervision Stairs Assistance Details (indicate cue type and reason): supervision for safety Stair Management Technique: Two rails Number of Stairs: 4     Exercises     PT Diagnosis:  Abnormality of gait;Altered mental status  PT Problem List: Decreased balance;Decreased safety awareness PT Treatment Interventions: Gait training;Functional mobility training;Therapeutic activities;Balance training;Patient/family education   PT Goals Acute Rehab PT Goals PT Goal Formulation: With patient Time For Goal Achievement: 05/04/12 Potential to Achieve Goals: Fair Pt will go Supine/Side to Sit: with modified independence;with HOB 0 degrees PT Goal: Supine/Side to Sit - Progress: Goal set today Pt will go Sit to Stand: with modified independence PT Goal: Sit to Stand - Progress: Goal set today Pt will go Stand to Sit: with modified independence PT Goal: Stand to Sit - Progress: Goal set today Pt will Stand: Independently;3 - 5 min;with no upper extremity support PT Goal: Stand - Progress: Goal set today Pt will Ambulate: >150 feet;with modified independence;with least restrictive assistive device PT Goal: Ambulate - Progress: Goal set today  Visit Information  Last PT Received On: 04/27/12 Assistance Needed: +1    Subjective Data  Subjective: Oh, I don't know Patient Stated Goal: return home   Prior Functioning  Home Living Lives With: Alone Available Help at Discharge: Family;Available PRN/intermittently (nephew down the street who assists with meals and housework) Type of Home: House Prior Function Able to Take Stairs?: Yes Comments: pt reports PLOF and home environment with consistency between report to OT and PT Communication Communication: No difficulties    Cognition  Overall Cognitive Status: Impaired Area of Impairment: Memory;Attention;Safety/judgement;Problem solving Arousal/Alertness: Awake/alert Orientation Level: Disoriented X4;Time;Place;Situation Behavior During Session: WFL for tasks performed Current Attention Level: Sustained Memory: Decreased recall of precautions Memory Deficits: Pt with STM grossly Following Commands: Follows one step  commands consistently Safety/Judgement: Decreased safety judgement for tasks assessed;Decreased awareness of need for assistance Safety/Judgement - Other Comments: Pt unable to state how to  call 911 in an emergency Cognition - Other Comments: Pt unable to state room number after 2 min but with immediate use of number on directional sign able to state correct way to turn to get to room    Extremity/Trunk Assessment Right Lower Extremity Assessment RLE ROM/Strength/Tone: [facility] for tasks assessed Left Lower Extremity Assessment LLE ROM/Strength/Tone: [facility] for tasks assessed   Balance Static Sitting Balance Static Sitting - Balance Support: No upper extremity supported;Feet supported Static Sitting - Level of Assistance: 6: Modified independent (Device/Increase time) Static Sitting - Comment/# of Minutes: 2 Static Standing Balance Static Standing - Balance Support: Left upper extremity supported;During functional activity Static Standing - Level of Assistance: 5: Stand by assistance Static Standing - Comment/# of Minutes: 3  End of Session PT - End of Session Equipment Utilized During Treatment: Gait belt Activity Tolerance: Patient tolerated treatment well Patient left: in chair;with call bell/phone within reach;with chair alarm set Nurse Communication: Mobility status   [name] 04/27/2012, 10:36 AM  Delaney Meigs, PT [phone]

## 2012-04-27 NOTE — Progress Notes (Signed)
SNF bed selected at [facility] and patient and family are agreeable to this plan- will coordinate transfer this pm via EMS. Reece Levy, MSW, Theresia Majors [phone]

## 2012-05-10 ENCOUNTER — Telehealth: Payer: Self-pay | Admitting: Internal Medicine

## 2012-08-09 ENCOUNTER — Encounter (HOSPITAL_COMMUNITY): Payer: Self-pay | Admitting: *Deleted

## 2012-08-09 ENCOUNTER — Inpatient Hospital Stay (HOSPITAL_COMMUNITY)
Admission: EM | Admit: 2012-08-09 | Discharge: 2012-08-11 | DRG: 603 | Disposition: A | Discharge status: Skilled Nursing Facility | Payer: Medicare PPO | Attending: Family Medicine | Admitting: Family Medicine

## 2012-08-09 DIAGNOSIS — L0390 Cellulitis, unspecified: Secondary | ICD-10-CM | POA: Diagnosis present

## 2012-08-09 DIAGNOSIS — L02419 Cutaneous abscess of limb, unspecified: Principal | ICD-10-CM | POA: Diagnosis present

## 2012-08-09 DIAGNOSIS — I10 Essential (primary) hypertension: Secondary | ICD-10-CM

## 2012-08-09 DIAGNOSIS — L03116 Cellulitis of left lower limb: Secondary | ICD-10-CM

## 2012-08-09 DIAGNOSIS — N289 Disorder of kidney and ureter, unspecified: Secondary | ICD-10-CM | POA: Diagnosis present

## 2012-08-09 DIAGNOSIS — E039 Hypothyroidism, unspecified: Secondary | ICD-10-CM | POA: Diagnosis present

## 2012-08-09 DIAGNOSIS — Z7982 Long term (current) use of aspirin: Secondary | ICD-10-CM

## 2012-08-09 DIAGNOSIS — N184 Chronic kidney disease, stage 4 (severe): Secondary | ICD-10-CM | POA: Diagnosis present

## 2012-08-09 DIAGNOSIS — R079 Chest pain, unspecified: Secondary | ICD-10-CM

## 2012-08-09 DIAGNOSIS — B9689 Other specified bacterial agents as the cause of diseases classified elsewhere: Secondary | ICD-10-CM | POA: Diagnosis present

## 2012-08-09 DIAGNOSIS — I129 Hypertensive chronic kidney disease with stage 1 through stage 4 chronic kidney disease, or unspecified chronic kidney disease: Secondary | ICD-10-CM | POA: Diagnosis present

## 2012-08-09 DIAGNOSIS — F0390 Unspecified dementia without behavioral disturbance: Secondary | ICD-10-CM | POA: Diagnosis present

## 2012-08-09 DIAGNOSIS — Z79899 Other long term (current) drug therapy: Secondary | ICD-10-CM

## 2012-08-09 DIAGNOSIS — M7989 Other specified soft tissue disorders: Secondary | ICD-10-CM

## 2012-08-09 DIAGNOSIS — F17200 Nicotine dependence, unspecified, uncomplicated: Secondary | ICD-10-CM | POA: Diagnosis present

## 2012-08-09 HISTORY — DX: Chest pain, unspecified: R07.9

## 2012-08-09 LAB — CBC WITH DIFFERENTIAL/PLATELET
Hemoglobin: 10.7 g/dL — ABNORMAL LOW (ref 12.0–15.0)
Lymphocytes Relative: 33 % (ref 12–46)
Lymphs Abs: 2.5 10*3/uL (ref 0.7–4.0)
Neutrophils Relative %: 55 % (ref 43–77)
Platelets: 246 10*3/uL (ref 150–400)
RBC: 3.34 MIL/uL — ABNORMAL LOW (ref 3.87–5.11)
WBC: 7.5 10*3/uL (ref 4.0–10.5)

## 2012-08-09 LAB — COMPREHENSIVE METABOLIC PANEL
ALT: 14 U/L (ref 0–35)
Alkaline Phosphatase: 75 U/L (ref 39–117)
CO2: 24 mEq/L (ref 19–32)
Chloride: 103 mEq/L (ref 96–112)
GFR calc Af Amer: 39 mL/min — ABNORMAL LOW (ref >90)
GFR calc non Af Amer: 33 mL/min — ABNORMAL LOW (ref >90)
Glucose, Bld: 82 mg/dL (ref 70–99)
Potassium: 4.3 mEq/L (ref 3.5–5.1)
Sodium: 136 mEq/L (ref 135–145)
Total Bilirubin: 0.2 mg/dL — ABNORMAL LOW (ref 0.3–1.2)

## 2012-08-09 MED ORDER — SODIUM CHLORIDE 0.9 % IV SOLN
INTRAVENOUS | Status: DC
  Administered 2012-08-10: 17:00:00 via INTRAVENOUS

## 2012-08-09 MED ORDER — CALCIUM CARBONATE 1250 (500 CA) MG PO TABS
1.0000 | ORAL_TABLET | Freq: Every day | ORAL | Status: DC
  Administered 2012-08-10 – 2012-08-11 (×2): 500 mg via ORAL
  Filled 2012-08-09 (×2): qty 1

## 2012-08-09 MED ORDER — PIPERACILLIN-TAZOBACTAM 3.375 G IVPB
3.3750 g | Freq: Three times a day (TID) | INTRAVENOUS | Status: DC
  Administered 2012-08-10 – 2012-08-11 (×4): 3.375 g via INTRAVENOUS
  Filled 2012-08-09 (×5): qty 50

## 2012-08-09 MED ORDER — VANCOMYCIN HCL IN DEXTROSE 1-5 GM/200ML-% IV SOLN
1000.0000 mg | Freq: Once | INTRAVENOUS | Status: AC
  Administered 2012-08-09: 1000 mg via INTRAVENOUS
  Filled 2012-08-09: qty 200

## 2012-08-09 MED ORDER — LORAZEPAM 0.5 MG PO TABS: 0.5000 mg | ORAL_TABLET | Freq: Two times a day (BID) | ORAL | Status: DC | PRN

## 2012-08-09 MED ORDER — ASPIRIN EC 81 MG PO TBEC
81.0000 mg | DELAYED_RELEASE_TABLET | Freq: Every day | ORAL | Status: DC
Start: 2012-08-09 — End: 2012-08-09
  Filled 2012-08-09: qty 1

## 2012-08-09 MED ORDER — HEPARIN SODIUM (PORCINE) 5000 UNIT/ML IJ SOLN
5000.0000 [IU] | Freq: Three times a day (TID) | INTRAMUSCULAR | Status: DC
  Administered 2012-08-09 – 2012-08-10 (×3): 5000 [IU] via SUBCUTANEOUS
  Filled 2012-08-09 (×8): qty 1

## 2012-08-09 MED ORDER — NITROGLYCERIN 0.4 MG SL SUBL: 0.4000 mg | SUBLINGUAL_TABLET | SUBLINGUAL | Status: DC | PRN

## 2012-08-09 MED ORDER — CALCIUM CARBONATE 1250 (500 CA) MG PO TABS
1.0000 | ORAL_TABLET | Freq: Every day | ORAL | Status: DC
  Filled 2012-08-09: qty 1

## 2012-08-09 MED ORDER — VANCOMYCIN HCL IN DEXTROSE 1-5 GM/200ML-% IV SOLN: 1000.0000 mg | INTRAVENOUS | Status: DC

## 2012-08-09 MED ORDER — ONDANSETRON HCL 4 MG PO TABS: 4.0000 mg | ORAL_TABLET | Freq: Four times a day (QID) | ORAL | Status: DC | PRN

## 2012-08-09 MED ORDER — PIPERACILLIN-TAZOBACTAM 3.375 G IVPB
3.3750 g | Freq: Once | INTRAVENOUS | Status: DC
  Administered 2012-08-09: 3.375 g via INTRAVENOUS
  Filled 2012-08-09: qty 50

## 2012-08-09 MED ORDER — ACETAMINOPHEN 325 MG PO TABS
650.0000 mg | ORAL_TABLET | Freq: Two times a day (BID) | ORAL | Status: DC
  Administered 2012-08-09 – 2012-08-11 (×3): 650 mg via ORAL
  Filled 2012-08-09 (×6): qty 2

## 2012-08-09 MED ORDER — ENSURE COMPLETE PO LIQD: 237.0000 mL | Freq: Two times a day (BID) | ORAL | Status: DC | PRN

## 2012-08-09 MED ORDER — LEVOTHYROXINE SODIUM 50 MCG PO TABS
50.0000 ug | ORAL_TABLET | Freq: Every day | ORAL | Status: DC
  Administered 2012-08-10 – 2012-08-11 (×2): 50 ug via ORAL
  Filled 2012-08-09 (×4): qty 1

## 2012-08-09 MED ORDER — ASPIRIN EC 81 MG PO TBEC
81.0000 mg | DELAYED_RELEASE_TABLET | Freq: Every day | ORAL | Status: DC
  Administered 2012-08-10 – 2012-08-11 (×2): 81 mg via ORAL
  Filled 2012-08-09 (×2): qty 1

## 2012-08-09 MED ORDER — ONDANSETRON HCL 4 MG/2ML IJ SOLN: 4.0000 mg | Freq: Four times a day (QID) | INTRAMUSCULAR | Status: DC | PRN

## 2012-08-09 NOTE — Progress Notes (Signed)
Report rec'd from ED RN Marlene Bast.  Pt coming by bed to rm 1343.  Son at bedside.

## 2012-08-09 NOTE — Progress Notes (Signed)
Pt's pcp listed as henry tripp on communication form from snf

## 2012-08-09 NOTE — H&P (Signed)
[facility] History and Physical  [name]:[id number] DOB: 1927-05-02 DOA: 08/09/2012  Referring physician: Dr. Ranae Palms PCP: Patient and family not sure   Chief Complaint: LLE redness and tenderness  HPI: [name] is a 78 y.o. female  Patient is an 78 y/o with recent history of I and D at LLE after she developed an abcess.  Was supposed to take doxycycline for 10 days after procedure.  After one week of taking antibiotics has not had improvement.  Is presenting to the Ed c/o worsening left leg discomfort, erythema, and more swollen than the RLE.  This problem occurred gradually over the last week and has progressively got worse.  Placing pressure on the leg causes discomfort.  Denies any shortness of breath or prolonged car rides.  Nothing she knows of makes it better.  In the Ed patient was ordered IV vanc and zosyn and doppler was ordered of LLE.  Given history of recent infection and drainage at site patient was not started on therapeutic anticoagulation as index of suspicion lower for DVT.  WBC in ED WNL's.  Patient is afebrile.  Also endorsed recent decrease in po intake.  Review of Systems: Unable to properly obtain due to dementia.   Past Medical History  Diagnosis Date  . Hypertension   . Hypothyroidism   . Mental disorder   . DEMENTIA   . Anxiety   . Chest pain 2013   History reviewed. No pertinent past surgical history. Social History:  reports that she has been smoking.  She has never used smokeless tobacco. She reports that she does not drink alcohol or use illicit drugs. SNF Can patient participate in ADLs? Yes but limited due to dementia  Allergies  Allergen Reactions  . Aricept (Donepezil Hcl) Nausea And Vomiting    Family History  Problem Relation Age of Onset  . Coronary aneurysm Father   . Heart failure Sister   None other reported when asked directly  Prior to Admission medications   Medication Sig Start Date End Date Taking?  Authorizing Provider  acetaminophen (TYLENOL) 325 MG tablet Take 650 mg by mouth 2 (two) times daily.   Yes Historical Provider, MD  aspirin EC 81 MG EC tablet Take 1 tablet (81 mg total) by mouth daily. 04/27/12 04/27/13 Yes Rodolph Bong, MD  calcium carbonate (OS-CAL - DOSED IN MG OF ELEMENTAL CALCIUM) 1250 MG tablet Take 1 tablet by mouth daily.   Yes Historical Provider, MD  doxycycline (VIBRA-TABS) 100 MG tablet Take 100 mg by mouth 2 (two) times daily.   Yes Historical Provider, MD  feeding supplement (ENSURE COMPLETE) LIQD Take 237 mLs by mouth 2 (two) times daily between meals as needed (suboptimal PO intake, underweight). 04/27/12  Yes Rodolph Bong, MD  levothyroxine (SYNTHROID, LEVOTHROID) 50 MCG tablet Take 50 mcg by mouth daily.   Yes Historical Provider, MD  LORazepam (ATIVAN) 0.5 MG tablet Take 0.5 mg by mouth 2 (two) times daily as needed. For anxiety/agitation   Yes Historical Provider, MD  Multiple Vitamins-Minerals (CERTAVITE SENIOR/ANTIOXIDANT PO) Take 1 tablet by mouth daily.   Yes Historical Provider, MD  nitroGLYCERIN (NITROSTAT) 0.4 MG SL tablet Place 1 tablet (0.4 mg total) under the tongue every 5 (five) minutes x 3 doses as needed for chest pain. 04/27/12 04/27/13 Yes Rodolph Bong, MD   Physical Exam: Filed Vitals:   08/09/12 1548 08/09/12 1606  BP: 138/63   Pulse: 62   Temp: 97.9 F (36.6 C)  TempSrc: Oral   Resp: 16   Height:  5\' 7"  (1.702 m)  Weight:  49.896 kg (110 lb)  SpO2: 99%      General:  Pt in NAD, laying supine smiling in bed  Eyes: EOMI, PERRLA  ENT: normal exterior appearance, no masses on visual inspection  Neck: supple, no goiter  Cardiovascular: RRR, No MRG  Respiratory: CTA BL, no wheezes  Abdomen: Soft, NT, ND  Skin: LLE cellulitis, + rubor and calor  Musculoskeletal: no cyanosis or clubbing  Psychiatric: mood and affect appropriate  Neurologic: answers questions appropriately, moves all extremities  Labs on Admission:   Basic Metabolic Panel:  Lab 08/09/12 8469  NA 136  K 4.3  CL 103  CO2 24  GLUCOSE 82  BUN 38*  CREATININE 1.41*  CALCIUM 9.1  MG --  PHOS --   Liver Function Tests:  Lab 08/09/12 1644  AST 21  ALT 14  ALKPHOS 75  BILITOT 0.2*  PROT 6.6  ALBUMIN 3.3*   No results found for this basename: LIPASE:5,AMYLASE:5 in the last 168 hours No results found for this basename: AMMONIA:5 in the last 168 hours CBC:  Lab 08/09/12 1644  WBC 7.5  NEUTROABS 4.1  HGB 10.7*  HCT 31.4*  MCV 94.0  PLT 246   Cardiac Enzymes: No results found for this basename: CKTOTAL:5,CKMB:5,CKMBINDEX:5,TROPONINI:5 in the last 168 hours  BNP (last 3 results) No results found for this basename: PROBNP:3 in the last 8760 hours CBG: No results found for this basename: GLUCAP:5 in the last 168 hours  Radiological Exams on Admission: No results found.   Assessment/Plan Principal Problem:  *Cellulitis Active Problems:  HTN (hypertension)  Dementia  Hypothyroidism  Renal insufficiency   1. Cellulitis - Place on IV antibiotics vancomycin and zosyn - transition to oral antibiotics once patient's po intake improves and erythema and calor improved - supportive care and pain control - Wound care consult - f/u with LLE dupplex  2. HTN - control with diet  - continue to monitor  3. Hypothyroidism - Continue home regimen  4. Dementia - Stable   5. Renal insufficiency - Place on gentle fluid hydration.  Likely creatinine elevated due to poor oral intake although her creatinine seems to be near baseline when compared to her prior values recorded in house.   Code Status: dnr Family Communication: spoke to patient and family member Disposition Plan: Pending clinical improvement in condition.  Time spent: > 45 minutes  [name] [facility] Pager [phone]  If 7PM-7AM, please contact night-coverage [email or web address] Password [facility] 08/09/2012, 6:13 PM

## 2012-08-09 NOTE — ED Notes (Signed)
Pt presented from [facility] off [address].   Pt hx dementia.  Pt oriented to time and place.  Pt present to room 6 via ems with c/o   Left leg pain.  Pt was in ed a week ago for I/D.  Pt physician at facility advised staff to send her to t ED for IV antibiotics.  Pt reports nausea x4 days.  Pt reports increased pain to left leg since I/D.  Per EMS, Vitals stable.  Pt afebrile per EMS.  Pt healthcare provider communication form reports non healing abscess with possible staph infection to left shin with odorous, green drainage.

## 2012-08-09 NOTE — Progress Notes (Signed)
VASCULAR LAB PRELIMINARY  PRELIMINARY  PRELIMINARY  PRELIMINARY  Left lower extremity venous duplex completed.    Preliminary report:  Left:  No evidence of DVT, superficial thrombosis, or Baker's cyst.  Nesanel Aguila, RVT 08/09/2012, 4:59 PM

## 2012-08-09 NOTE — Progress Notes (Signed)
ANTIBIOTIC CONSULT NOTE - INITIAL  Pharmacy Consult for Vanc/Zosyn Indication: Cellulits  Allergies  Allergen Reactions  . Aricept (Donepezil Hcl) Nausea And Vomiting    Patient Measurements: Height: 5\' 7"  (170.2 cm) Weight: 110 lb (49.896 kg) IBW/kg (Calculated) : 61.6  Adjusted Body Weight:   Vital Signs: Temp: 98.2 F (36.8 C) (09/19 1915) Temp src: Oral (09/19 1915) BP: 143/47 mmHg (09/19 1915) Pulse Rate: 65  (09/19 1915) Intake/Output from previous day:   Intake/Output from this shift:    Labs:  Basename 08/09/12 1644  WBC 7.5  HGB 10.7*  PLT 246  LABCREA --  CREATININE 1.41*   Estimated Creatinine Clearance: 23.8 ml/min (by C-G formula based on Cr of 1.41). No results found for this basename: VANCOTROUGH:2,VANCOPEAK:2,VANCORANDOM:2,GENTTROUGH:2,GENTPEAK:2,GENTRANDOM:2,TOBRATROUGH:2,TOBRAPEAK:2,TOBRARND:2,AMIKACINPEAK:2,AMIKACINTROU:2,AMIKACIN:2, in the last 72 hours   Microbiology: No results found for this or any previous visit (from the past 720 hour(s)).  Medical History: Past Medical History  Diagnosis Date  . Hypertension   . Hypothyroidism   . Mental disorder   . DEMENTIA   . Anxiety   . Chest pain 2013    Medications:  Scheduled:    . acetaminophen  650 mg Oral BID  . aspirin EC  81 mg Oral Daily  . calcium carbonate  1 tablet Oral Daily  . heparin  5,000 Units Subcutaneous Q8H  . levothyroxine  50 mcg Oral QAC breakfast  . vancomycin  1,000 mg Intravenous Once  . DISCONTD: aspirin EC  81 mg Oral Daily  . DISCONTD: calcium carbonate  1 tablet Oral Daily  . DISCONTD: piperacillin-tazobactam (ZOSYN)  IV  3.375 g Intravenous Once   Infusions:    . sodium chloride     PRN: feeding supplement, LORazepam, nitroGLYCERIN, ondansetron (ZOFRAN) IV, ondansetron Assessment:  78 yo F admitted via ER with nonhealing wound over proximal Left tibia  Symptoms include erythema,swelling,tenderness, purulent drainage from site of previous I&D Goal  of Therapy:  Vancomycin trough level 10-15 mcg/ml  Plan:  1.  Zosyn 3.375Gm IV q 8 hours -- infuse over 4 hours 2. Vancomycin 1 Gm IV q 48 hours 3. Check vanc trough at steady state 4. Monitor renal function and culture results  Measure antibiotic drug levels at steady state Follow up culture results  [name] 08/09/2012,8:18 PM

## 2012-08-09 NOTE — ED Provider Notes (Signed)
History     CSN: [id number]  Arrival date & time 08/09/12  1538   First MD Initiated Contact with Patient 08/09/12 1604      Chief Complaint  Patient presents with  . Leg Pain    (Consider location/radiation/quality/duration/timing/severity/associated sxs/prior treatment) HPI Pt p/w several days of LLE swelling, redness, foul odor. She has had nausea, chills and increasing fatigue. Eval'd at [facility] by MD and sent to ED for IV abx. Pt has non-healing wound over proximal L tibia from previous I and D. Purulent d/c from site.  Past Medical History  Diagnosis Date  . Hypertension   . Hypothyroidism   . Mental disorder   . DEMENTIA   . Anxiety   . Chest pain 2013    History reviewed. No pertinent past surgical history.  Family History  Problem Relation Age of Onset  . Coronary aneurysm Father   . Heart failure Sister     History  Substance Use Topics  . Smoking status: Current Some Day Smoker -- 63 years  . Smokeless tobacco: Never Used  . Alcohol Use: No    OB History    Grav Para Term Preterm Abortions TAB SAB Ect Mult Living                  Review of Systems  Constitutional: Positive for chills and fatigue. Negative for fever.  Respiratory: Negative for shortness of breath.   Cardiovascular: Negative for chest pain.  Gastrointestinal: Positive for nausea. Negative for vomiting and abdominal pain.  Musculoskeletal: Negative for back pain.  Skin: Positive for color change and wound.  Neurological: Negative for dizziness, weakness, numbness and headaches.    Allergies  Aricept  Home Medications   Current Outpatient Rx  Name Route Sig Dispense Refill  . ACETAMINOPHEN 325 MG PO TABS Oral Take 650 mg by mouth 2 (two) times daily.    . ASPIRIN 81 MG PO TBEC Oral Take 1 tablet (81 mg total) by mouth daily.    Marland Kitchen CALCIUM CARBONATE 1250 MG PO TABS Oral Take 1 tablet by mouth daily.    Marland Kitchen DOXYCYCLINE HYCLATE 100 MG PO TABS Oral Take 100 mg by mouth 2 (two) times  daily.    Marland Kitchen ENSURE COMPLETE PO LIQD Oral Take 237 mLs by mouth 2 (two) times daily between meals as needed (suboptimal PO intake, underweight).    . LEVOTHYROXINE SODIUM 50 MCG PO TABS Oral Take 50 mcg by mouth daily.    Marland Kitchen LORAZEPAM 0.5 MG PO TABS Oral Take 0.5 mg by mouth 2 (two) times daily as needed. For anxiety/agitation    . CERTAVITE SENIOR/ANTIOXIDANT PO Oral Take 1 tablet by mouth daily.    Marland Kitchen NITROGLYCERIN 0.4 MG SL SUBL Sublingual Place 1 tablet (0.4 mg total) under the tongue every 5 (five) minutes x 3 doses as needed for chest pain. 20 tablet 0    BP 138/63  Pulse 62  Temp 97.9 F (36.6 C) (Oral)  Resp 16  Ht 5\' 7"  (1.702 m)  Wt 110 lb (49.896 kg)  BMI 17.23 kg/m2  SpO2 99%  Physical Exam  Nursing note and vitals reviewed. Constitutional: She is oriented to person, place, and time. She appears well-developed and well-nourished. No distress.  HENT:  Head: Normocephalic and atraumatic.  Mouth/Throat: Oropharynx is clear and moist.  Eyes: EOM are normal. Pupils are equal, round, and reactive to light.  Neck: Normal range of motion. Neck supple.  Cardiovascular: Normal rate and regular rhythm.   Pulmonary/Chest:  Effort normal and breath sounds normal. No respiratory distress. She has no wheezes. She has no rales.  Abdominal: Soft. Bowel sounds are normal. She exhibits no distension and no mass. There is no tenderness. There is no rebound and no guarding.  Musculoskeletal: Normal range of motion. She exhibits edema. She exhibits no tenderness.       L proximal anterior tibia open wound with minimal purulence. No definite mass or fluctulance. Surrounding erythema and warmth extending distally to L foot. Pulses intact distally.   Neurological: She is alert and oriented to person, place, and time.  Skin: Skin is warm and dry. No rash noted. There is erythema.  Psychiatric: She has a normal mood and affect. Her behavior is normal.    ED Course  Procedures (including critical  care time)  Labs Reviewed  CBC WITH DIFFERENTIAL - Abnormal; Notable for the following:    RBC 3.34 (*)     Hemoglobin 10.7 (*)     HCT 31.4 (*)     All other components within normal limits  COMPREHENSIVE METABOLIC PANEL - Abnormal; Notable for the following:    BUN 38 (*)     Creatinine, Ser 1.41 (*)     Albumin 3.3 (*)     Total Bilirubin 0.2 (*)     GFR calc non Af Amer 33 (*)     GFR calc Af Amer 39 (*)     All other components within normal limits  WOUND CULTURE   No results found.   1. Cellulitis of left lower extremity       MDM   Discussed with [facility] and will admit for IV abx       Loren Racer, MD 08/09/12 1757

## 2012-08-09 NOTE — Progress Notes (Signed)
Patient was seen earlier this morning by my associate.  Please review their initial h and p for details regarding plan.  Will go ahead and consult GI for further recommendations given patient's recent history.  Jj Enyeart, [employer]

## 2012-08-10 DIAGNOSIS — L0390 Cellulitis, unspecified: Secondary | ICD-10-CM

## 2012-08-10 LAB — BASIC METABOLIC PANEL
CO2: 26 mEq/L (ref 19–32)
Calcium: 8.7 mg/dL (ref 8.4–10.5)
Chloride: 107 mEq/L (ref 96–112)
Creatinine, Ser: 1.41 mg/dL — ABNORMAL HIGH (ref 0.50–1.10)
GFR calc Af Amer: 39 mL/min — ABNORMAL LOW (ref >90)
GFR calc non Af Amer: 33 mL/min — ABNORMAL LOW (ref >90)
Glucose, Bld: 84 mg/dL (ref 70–99)
Potassium: 4.3 mEq/L (ref 3.5–5.1)

## 2012-08-10 LAB — CBC
Platelets: 237 10*3/uL (ref 150–400)
RBC: 3.11 MIL/uL — ABNORMAL LOW (ref 3.87–5.11)
RDW: 14.7 % (ref 11.5–15.5)
WBC: 7 10*3/uL (ref 4.0–10.5)

## 2012-08-10 NOTE — Progress Notes (Signed)
Pt is able to return to ALF over weekend. This CSW will leave hand-off for weekend CSW re: possible d/c over weekend.  Dellie Burns, MSW, [place] [phone] (coverage)

## 2012-08-10 NOTE — Progress Notes (Signed)
Clinical Social Work Department BRIEF PSYCHOSOCIAL ASSESSMENT 08/10/2012  Patient:  [name], [name]     Account Number:  [id number]     Admit date:  08/09/2012  Clinical Social Worker:  Skip Mayer  Date/Time:  08/10/2012 12:00 N  Referred by:  Physician  Date Referred:  08/10/2012 Referred for  ALF Placement   Other Referral:   Interview type:  Family Other interview type:    PSYCHOSOCIAL DATA Living Status:  FACILITY Admitted from facility:  [name] [place] Level of care:  Assisted Living Primary support name:  [name]/HCPOA Primary support relationship to patient:  FAMILY Degree of support available:   Adequate    CURRENT CONCERNS Current Concerns  Post-Acute Placement   Other Concerns:    SOCIAL WORK ASSESSMENT / PLAN CSW spoke with pt's nephew/HCPOA, [name], re: dispo.  Pt admitted from [facility] and plan is for pt to return at d/c, per HCPOA.  CSW confirmed with [name] that pt is able to return when stable--weekend admission accepted.  CSW to complete FL2 and place on chart for MD signature.  Will continue to follow and assist with d/c when pt stable.   Assessment/plan status:  Information/Referral to [facility] Other assessment/ plan:   Information/referral to community resources:   ALF  PTAR    PATIENT'S/FAMILY'S RESPONSE TO PLAN OF CARE: Pt's HCPOA reports agreeable to pt return to ALF at d/c.  Pt's HCPOA with questions re: pt's cellulitis encouraged him to speak with RN/MD.        [name], MSW, LCSWA [phone] (coverage)

## 2012-08-10 NOTE — Progress Notes (Signed)
INITIAL ADULT NUTRITION ASSESSMENT Date: 08/10/2012   Time: 1:15 PM Reason for Assessment: Nutrition risk   INTERVENTION: Ensure Complete BID. Hopefully pt will be without any further nausea. Will monitor.   Pt meets criteria for severe malnutrition as evidenced by likely <75% estimated energy intake in the past month from nausea and pt with severe muscle and fat loss in upper extremities and clavicles.   ASSESSMENT: Female 78 y.o.  Dx: Cellulitis  Food/Nutrition Related Hx: Pt with dementia, however was able to state that she has been eating less in the past 2-3 weeks because of nausea. Pt reports 30 pound unintended weight loss in the past year, however unsure how accurate this is. Pt denies any problems chewing or swallowing. Pt reports improvement in nausea today.  Hx:  Past Medical History  Diagnosis Date  . Hypertension   . Hypothyroidism   . Mental disorder   . DEMENTIA   . Anxiety   . Chest pain 2013    Related Meds:  Scheduled Meds:   . acetaminophen  650 mg Oral BID  . aspirin EC  81 mg Oral Daily  . calcium carbonate  1 tablet Oral Daily  . heparin  5,000 Units Subcutaneous Q8H  . levothyroxine  50 mcg Oral QAC breakfast  . piperacillin-tazobactam (ZOSYN)  IV  3.375 g Intravenous Q8H  . vancomycin  1,000 mg Intravenous Once  . vancomycin  1,000 mg Intravenous Q48H  . DISCONTD: aspirin EC  81 mg Oral Daily  . DISCONTD: calcium carbonate  1 tablet Oral Daily  . DISCONTD: piperacillin-tazobactam (ZOSYN)  IV  3.375 g Intravenous Once   Continuous Infusions:   . sodium chloride 75 mL/hr at 08/09/12 2015   PRN Meds:.feeding supplement, LORazepam, nitroGLYCERIN, ondansetron (ZOFRAN) IV, ondansetron  Ht: 5\' 7"  (170.2 cm)  Wt: 110 lb (49.896 kg) with moderate pitting LLE edema  Ideal Wt: 135 lb % Ideal Wt: 81  Usual Wt: 140 lb per pt report % Usual Wt: 78  Wt Readings from Last 10 Encounters:  08/09/12 110 lb (49.896 kg)  04/24/12 113 lb 5.1 oz (51.4 kg)       Body mass index is 17.23 kg/(m^2).    Labs:  CMP     Component Value Date/Time   NA 139 08/10/2012 0350   K 4.3 08/10/2012 0350   CL 107 08/10/2012 0350   CO2 26 08/10/2012 0350   GLUCOSE 84 08/10/2012 0350   BUN 31* 08/10/2012 0350   CREATININE 1.41* 08/10/2012 0350   CALCIUM 8.7 08/10/2012 0350   PROT 6.6 08/09/2012 1644   ALBUMIN 3.3* 08/09/2012 1644   AST 21 08/09/2012 1644   ALT 14 08/09/2012 1644   ALKPHOS 75 08/09/2012 1644   BILITOT 0.2* 08/09/2012 1644   GFRNONAA 33* 08/10/2012 0350   GFRAA 39* 08/10/2012 0350    Intake/Output Summary (Last 24 hours) at 08/10/12 1326 Last data filed at 08/10/12 0800  Gross per 24 hour  Intake    550 ml  Output      0 ml  Net    550 ml   Last BM - 9/19  Diet Order: Cardiac   IVF:    sodium chloride Last Rate: 75 mL/hr at 08/09/12 2015    Estimated Nutritional Needs:   Kcal:1500-1750 Protein:75-90g Fluid:1.5-17L  NUTRITION DIAGNOSIS: -Inadequate oral intake (NI-2.1).  Status: Ongoing  RELATED TO: nausea  AS EVIDENCE BY: pt statement  MONITORING/EVALUATION(Goals): Pt to consume >90% of meals.   EDUCATION NEEDS: -Education not appropriate at  this time  Dietitian #: [phone]  DOCUMENTATION CODES Per approved criteria  -Severe malnutrition in the context of chronic illness -Underweight    Marshall Cork 08/10/2012, 1:15 PM

## 2012-08-10 NOTE — Clinical Documentation Improvement (Deleted)
RENAL FAILURE DOCUMENTATION CLARIFICATION QUERY  THIS DOCUMENT IS NOT A PERMANENT PART OF THE MEDICAL RECORD  TO RESPOND TO THE THIS QUERY, FOLLOW THE INSTRUCTIONS BELOW:  1. If needed, update documentation for the patient's encounter via the notes activity.  2. Access this query again and click edit on the In [employer].  3. After updating, or not, click F2 to complete all highlighted (required) fields concerning your review. Select "additional documentation in the medical record" OR "no additional documentation provided".  4. Click Sign note button.  5. The deficiency will fall out of your In Basket *Please let us know if you are not able to complete this workflow by phone or e-mail (listed below).  Please update your documentation within the medical record to reflect your response to this query.                                                                                    08/10/12  Dear Dr.[facility],  In a better effort to capture your patient's severity of illness, reflect appropriate length of stay and utilization of resources, a review of the patient medical record has revealed the following indicators.    Based on your clinical judgment, please clarify and document in a progress note and/or discharge summary the clinical condition associated with the following supporting information:  In responding to this query please exercise your independent judgment.  The fact that a query is asked, does not imply that any particular answer is desired or expected.  08/10/12 Prog note.Marland KitchenMarland Kitchen"Renal insufficiency.".Marland KitchenMarland KitchenFor accurate Dx specificity & severity can noted "Renal insufficiency" be further clarified w/ cond being eval'd, mon, and tx'd. Thank you  Possible Clinical Conditions?  Acute Renal Failure Acute Kidney Injury  Acute Tubular Necrosis Acute Renal Cortical Necrosis Acute Renal Medullary Necrosis  Acute on Chronic Renal Failure Chronic Renal  Failure  Anuria Oliguria  Other Condition (please specify) Cannot Clinically Determine  Supporting Information:  Risk Factors: 08/09/12 H&P.Marland KitchenMarland Kitchen"Renal insufficiency- Place on gentle fluid hydration. Likely creatinine elevated due to poor oral intake although her creatinine seems to be near baseline ..."  Signs and Symptoms: see above note  Diagnostics: 08/10/12 0350                   08/09/12 1644  NA 139                                           136  K 4.3                                                4.3  CL 107                                            103  CO2 26  24  GLUCOSE 84                                  82  BUN 31*                                           38*  CREATININE 1.41*                            1.41  -GFR: 08/09/12:GFR calc non Af Amer 33 (*)   Treatments: 08/09/12 H&P..." Place on gentle fluid hydration"... Trend labs; sodium chloride 75 mL/hr at 08/09/12 2015                You may use possible, probable, or suspect with inpatient documentation. possible, probable, suspected diagnoses MUST be documented at the time of discharge  Reviewed: additional documentation in the medical record  Thank You,  Toribio Harbour, RN, BSN, CCDS Certified Clinical Documentation Specialist Pager: [phone]  Health Information Management [place]

## 2012-08-10 NOTE — Progress Notes (Addendum)
[facility] PROGRESS NOTE  Kim VENNEMAN ZOX:[id number] DOB: [birth year] DOA: 08/09/2012 PCP: Florentina Jenny, MD  Assessment/Plan: Principal Problem:  *Cellulitis Active Problems:  HTN (hypertension)  Dementia  Hypothyroidism  Renal insufficiency  1. Cellulitis - Much improved today.  Continue wound consult and follow with their recommendations.  Otherwise keep clean and continued dressing changes.  Will continue for today Vancomycin and Zosyn.  Plan will be to transition to oral antibiotic (likely bactrim) 9/21 and possible discharge with continued improvement. - Wound culture shows no growth  2. HTN - Will restrict sodium to 2 grams per day - continue to monitor  3. Dementia - Stable   4. Hypothyroidism - Continue synthroid  Addendum: 5. Renal insufficiency: Likely chronic renal failure at this juncture.  Patient creatinine ranged from 1.3-2.0.  Will continue to monitor  Code Status: DNR Family Communication: No family at bedside Disposition Plan: Pending continued clinical improvement.   Brief narrative: Please see HPI  Consultants:  none  Procedures:  None this admission  Antibiotics:  IV vanc and Zosyn  HPI/Subjective: Patient mentions that her leg feels better.  Denies any nausea today.  No acute issues overnight.  Objective: Filed Vitals:   08/09/12 1548 08/09/12 1606 08/09/12 1915 08/10/12 0542  BP: 138/63  143/47 113/44  Pulse: 62  65 53  Temp: 97.9 F (36.6 C)  98.2 F (36.8 C) 97.8 F (36.6 C)  TempSrc: Oral  Oral Oral  Resp: 16  18 18   Height:  5\' 7"  (1.702 m)    Weight:  49.896 kg (110 lb)    SpO2: 99%  100% 100%    Intake/Output Summary (Last 24 hours) at 08/10/12 1102 Last data filed at 08/10/12 0800  Gross per 24 hour  Intake    550 ml  Output      0 ml  Net    550 ml   Filed Weights   08/09/12 1606  Weight: 49.896 kg (110 lb)    Exam:   General:  Pt in NAD, Alert and Awake  Cardiovascular: RRR, No  MRG  Respiratory: CTA BL, no wheezes  Abdomen: Soft, NT, ND  Extremities: LLE cellulitis with erythema and pain on palpation.  Less erythematous today 9/20  Data Reviewed: Basic Metabolic Panel:  Lab 08/10/12 8119 08/09/12 1644  NA 139 136  K 4.3 4.3  CL 107 103  CO2 26 24  GLUCOSE 84 82  BUN 31* 38*  CREATININE 1.41* 1.41*  CALCIUM 8.7 9.1  MG -- --  PHOS -- --   Liver Function Tests:  Lab 08/09/12 1644  AST 21  ALT 14  ALKPHOS 75  BILITOT 0.2*  PROT 6.6  ALBUMIN 3.3*   No results found for this basename: LIPASE:5,AMYLASE:5 in the last 168 hours No results found for this basename: AMMONIA:5 in the last 168 hours CBC:  Lab 08/10/12 0350 08/09/12 1644  WBC 7.0 7.5  NEUTROABS -- 4.1  HGB 10.1* 10.7*  HCT 29.0* 31.4*  MCV 93.2 94.0  PLT 237 246   Cardiac Enzymes: No results found for this basename: CKTOTAL:5,CKMB:5,CKMBINDEX:5,TROPONINI:5 in the last 168 hours BNP (last 3 results) No results found for this basename: PROBNP:3 in the last 8760 hours CBG: No results found for this basename: GLUCAP:5 in the last 168 hours  Recent Results (from the past 240 hour(s))  WOUND CULTURE     Status: Normal (Preliminary result)   Collection Time   08/09/12  5:10 PM      Component Value  Range Status Comment   Specimen Description LEG   Final    Special Requests Normal   Final    Gram Stain     Final    Value: FEW WBC PRESENT, PREDOMINANTLY PMN     RARE SQUAMOUS EPITHELIAL CELLS PRESENT     ABUNDANT GRAM NEGATIVE RODS     FEW GRAM POSITIVE COCCI IN PAIRS     IN CHAINS IN CLUSTERS   Culture NO GROWTH   Final    Report Status PENDING   Incomplete      Studies: No results found.  Scheduled Meds:   . acetaminophen  650 mg Oral BID  . aspirin EC  81 mg Oral Daily  . calcium carbonate  1 tablet Oral Daily  . heparin  5,000 Units Subcutaneous Q8H  . levothyroxine  50 mcg Oral QAC breakfast  . piperacillin-tazobactam (ZOSYN)  IV  3.375 g Intravenous Q8H  .  vancomycin  1,000 mg Intravenous Once  . vancomycin  1,000 mg Intravenous Q48H  . DISCONTD: aspirin EC  81 mg Oral Daily  . DISCONTD: calcium carbonate  1 tablet Oral Daily  . DISCONTD: piperacillin-tazobactam (ZOSYN)  IV  3.375 g Intravenous Once   Continuous Infusions:   . sodium chloride 75 mL/hr at 08/09/12 2015    Principal Problem:  *Cellulitis Active Problems:  HTN (hypertension)  Dementia  Hypothyroidism  Renal insufficiency    Time spent: > 30 minutes    [name]  [facility] Pager [phone] If 8PM-8AM, please contact night-coverage at [email or web address], password [facility] 08/10/2012, 11:02 AM  LOS: 1 day

## 2012-08-11 MED ORDER — CLINDAMYCIN HCL 150 MG PO CAPS
150.0000 mg | ORAL_CAPSULE | Freq: Four times a day (QID) | ORAL | Status: DC
  Filled 2012-08-11 (×4): qty 1

## 2012-08-11 MED ORDER — SULFAMETHOXAZOLE-TMP DS 800-160 MG PO TABS: 1.0000 | ORAL_TABLET | Freq: Two times a day (BID) | ORAL | Status: DC

## 2012-08-11 MED ORDER — CLINDAMYCIN HCL 150 MG PO CAPS: 150.0000 mg | ORAL_CAPSULE | Freq: Four times a day (QID) | ORAL | Status: DC

## 2012-08-11 NOTE — Progress Notes (Signed)
Nurse from [facility] updated on patient's status, with no questions.  Patient will be transported back [place] via [facility].  Philomena Doheny RN

## 2012-08-11 NOTE — Progress Notes (Signed)
Pt to be d/c today to [facility].   Pt and family agreeable. Confirmed plans with facility.  Plan transfer via EMS.   Leron Croak, LCSWA [employer] Coverage [phone]

## 2012-08-11 NOTE — Discharge Summary (Signed)
Physician Discharge Summary  [name] QIO:[id number] DOB: Nov 19, 1927 DOA: 08/09/2012  PCP: Florentina Jenny, MD  Admit date: 08/09/2012 Discharge date: 08/11/2012  Recommendations for Outpatient Follow-up:  1. Please be sure to check wound and decide whether or not patient will require more days of antibiotic therapy.  Discharge Diagnoses:  Principal Problem:  *Cellulitis Active Problems:  HTN (hypertension)  Dementia  Hypothyroidism  Renal insufficiency   Discharge Condition: stable  Diet recommendation: Heart healthy/low sodium  Filed Weights   08/09/12 1606  Weight: 49.896 kg (110 lb)    History of present illness:  From original HPI: [name] is a 78 y.o. female  Patient is an 78 y/o with recent history of I and D at [facility] after she developed an abcess. Was supposed to take doxycycline for 10 days after procedure. After one week of taking antibiotics has not had improvement. Is presenting to the Ed c/o worsening left leg discomfort, erythema, and more swollen than the RLE. This problem occurred gradually over the last week and has progressively got worse. Placing pressure on the leg causes discomfort. Denies any shortness of breath or prolonged car rides. Nothing she knows of makes it better.  In the Ed patient was ordered IV vanc and zosyn and doppler was ordered of [facility]. Given history of recent infection and drainage at site patient was not started on therapeutic anticoagulation as index of suspicion lower for DVT. WBC in ED WNL's. Patient is afebrile. Also endorsed recent decrease in po intake.  Hospital Course:   1. Cellulitis - Much improved today on IV vancomycin and zosyn.  Patient's po intake improved as well as a marked decreased in erythema and pain on palpation.  Lower extremity venous duplex was negative for DVT.  Will transition to oral clindamycin and treat for the next 8 days to complete a 10 day course total.  Have patient follow up with PCP to decide whether or  not patient will require any more antibiotics as outpatient. - Wound culture shows few gram postitive cocci in pairs in chains and in clusters - Patient failed outpatient doxycycline therapy  2. HTN  - Will restrict sodium to 2 grams per day  - continue to monitor  - Recommend diet as above for control  3. Dementia  - Stable   4. Hypothyroidism  - Continue synthroid   5. Renal insufficiency:  Likely chronic renal failure at this juncture. Patient has a creatinine clearance in the mid 20's as such suspect CKD stage 3-4.  Should continue to monitor closely as outpatient.  Will avoid bactrim and place on clindamycin as there is no renal adjustment on this medication.    Procedures:  None  Consultations:  none  Discharge Exam: Filed Vitals:   08/10/12 0542 08/10/12 1410 08/10/12 2012 08/11/12 0525  BP: 113/44 125/57 136/54 145/53  Pulse: 53 55 52 65  Temp: 97.8 F (36.6 C) 97.6 F (36.4 C) 97.9 F (36.6 C) 97.4 F (36.3 C)  TempSrc: Oral Oral Oral Oral  Resp: 18 16 18 18   Height:      Weight:      SpO2: 100% 100% 100% 97%    General: Pt in NAD, Alert and Awake Cardiovascular: RRR, No MRG Respiratory: CTA BL, no wheezes Extremity: cellulitis and drained abscess at anterior left leg with no purulent discharge.  Discharge Instructions  Discharge Orders    Future Orders Please Complete By Expires   Diet - low sodium heart healthy  Increase activity slowly      Discharge instructions      Comments:   Please take antibiotics as indicated.  Also follow up with primary care physician in ~ 1 week or sooner should you have any other concerns.   Call MD for:  temperature >100.4      Call MD for:  severe uncontrolled pain          Medication List     As of 08/11/2012  9:40 AM    STOP taking these medications         doxycycline 100 MG tablet   Commonly known as: VIBRA-TABS      TAKE these medications         acetaminophen 325 MG tablet   Commonly known  as: TYLENOL   Take 650 mg by mouth 2 (two) times daily.      aspirin 81 MG EC tablet   Take 1 tablet (81 mg total) by mouth daily.      calcium carbonate 1250 MG tablet   Commonly known as: OS-CAL - dosed in mg of elemental calcium   Take 1 tablet by mouth daily.      CERTAVITE SENIOR/ANTIOXIDANT PO   Take 1 tablet by mouth daily.      clindamycin 150 MG capsule   Commonly known as: CLEOCIN   Take 1 capsule (150 mg total) by mouth every 6 (six) hours.      feeding supplement Liqd   Take 237 mLs by mouth 2 (two) times daily between meals as needed (suboptimal PO intake, underweight).      levothyroxine 50 MCG tablet   Commonly known as: SYNTHROID, LEVOTHROID   Take 50 mcg by mouth daily.      LORazepam 0.5 MG tablet   Commonly known as: ATIVAN   Take 0.5 mg by mouth 2 (two) times daily as needed. For anxiety/agitation      nitroGLYCERIN 0.4 MG SL tablet   Commonly known as: NITROSTAT   Place 1 tablet (0.4 mg total) under the tongue every 5 (five) minutes x 3 doses as needed for chest pain.          The results of significant diagnostics from this hospitalization (including imaging, microbiology, ancillary and laboratory) are listed below for reference.    Significant Diagnostic Studies: No results found.  Microbiology: Recent Results (from the past 240 hour(s))  WOUND CULTURE     Status: Normal (Preliminary result)   Collection Time   08/09/12  5:10 PM      Component Value Range Status Comment   Specimen Description LEG   Final    Special Requests Normal   Final    Gram Stain     Final    Value: FEW WBC PRESENT, PREDOMINANTLY PMN     RARE SQUAMOUS EPITHELIAL CELLS PRESENT     ABUNDANT GRAM NEGATIVE RODS     FEW GRAM POSITIVE COCCI IN PAIRS     IN CHAINS IN CLUSTERS   Culture Culture reincubated for better growth   Final    Report Status PENDING   Incomplete      Labs: Basic Metabolic Panel:  Lab 08/10/12 4098 08/09/12 1644  NA 139 136  K 4.3 4.3  CL 107  103  CO2 26 24  GLUCOSE 84 82  BUN 31* 38*  CREATININE 1.41* 1.41*  CALCIUM 8.7 9.1  MG -- --  PHOS -- --   Liver Function Tests:  Lab 08/09/12 1644  AST 21  ALT 14  ALKPHOS 75  BILITOT 0.2*  PROT 6.6  ALBUMIN 3.3*   No results found for this basename: LIPASE:5,AMYLASE:5 in the last 168 hours No results found for this basename: AMMONIA:5 in the last 168 hours CBC:  Lab 08/10/12 0350 08/09/12 1644  WBC 7.0 7.5  NEUTROABS -- 4.1  HGB 10.1* 10.7*  HCT 29.0* 31.4*  MCV 93.2 94.0  PLT 237 246   Cardiac Enzymes: No results found for this basename: CKTOTAL:5,CKMB:5,CKMBINDEX:5,TROPONINI:5 in the last 168 hours BNP: BNP (last 3 results) No results found for this basename: PROBNP:3 in the last 8760 hours CBG: No results found for this basename: GLUCAP:5 in the last 168 hours  Time coordinating discharge: > 35 minutes  Signed:  [name]  [facility] 08/11/2012, 9:40 AM

## 2012-08-13 LAB — WOUND CULTURE: Special Requests: NORMAL

## 2012-09-19 ENCOUNTER — Encounter: Payer: Self-pay | Admitting: Nurse Practitioner

## 2012-09-19 ENCOUNTER — Encounter: Payer: Self-pay | Admitting: Cardiothoracic Surgery

## 2012-09-21 ENCOUNTER — Encounter: Payer: Self-pay | Admitting: Nurse Practitioner

## 2012-09-21 ENCOUNTER — Encounter: Payer: Self-pay | Admitting: Cardiothoracic Surgery

## 2012-10-09 LAB — PATHOLOGY REPORT

## 2013-02-10 ENCOUNTER — Other Ambulatory Visit: Payer: Self-pay

## 2013-02-10 ENCOUNTER — Emergency Department (HOSPITAL_COMMUNITY)
Admission: EM | Admit: 2013-02-10 | Discharge: 2013-02-11 | Disposition: A | Discharge status: Home / Self Care | Payer: Medicare Other | Attending: Emergency Medicine | Admitting: Emergency Medicine

## 2013-02-10 ENCOUNTER — Emergency Department (HOSPITAL_COMMUNITY): Payer: Medicare Other

## 2013-02-10 DIAGNOSIS — S32591A Other specified fracture of right pubis, initial encounter for closed fracture: Secondary | ICD-10-CM

## 2013-02-10 DIAGNOSIS — W19XXXA Unspecified fall, initial encounter: Secondary | ICD-10-CM | POA: Insufficient documentation

## 2013-02-10 DIAGNOSIS — S32509A Unspecified fracture of unspecified pubis, initial encounter for closed fracture: Secondary | ICD-10-CM | POA: Insufficient documentation

## 2013-02-10 DIAGNOSIS — Y9210 Unspecified residential institution as the place of occurrence of the external cause: Secondary | ICD-10-CM | POA: Insufficient documentation

## 2013-02-10 DIAGNOSIS — I10 Essential (primary) hypertension: Secondary | ICD-10-CM | POA: Insufficient documentation

## 2013-02-10 DIAGNOSIS — F0390 Unspecified dementia without behavioral disturbance: Secondary | ICD-10-CM | POA: Insufficient documentation

## 2013-02-10 DIAGNOSIS — Z8659 Personal history of other mental and behavioral disorders: Secondary | ICD-10-CM | POA: Insufficient documentation

## 2013-02-10 DIAGNOSIS — F489 Nonpsychotic mental disorder, unspecified: Secondary | ICD-10-CM | POA: Insufficient documentation

## 2013-02-10 DIAGNOSIS — Y9389 Activity, other specified: Secondary | ICD-10-CM | POA: Insufficient documentation

## 2013-02-10 DIAGNOSIS — Z8679 Personal history of other diseases of the circulatory system: Secondary | ICD-10-CM | POA: Insufficient documentation

## 2013-02-10 DIAGNOSIS — Z79899 Other long term (current) drug therapy: Secondary | ICD-10-CM | POA: Insufficient documentation

## 2013-02-10 DIAGNOSIS — E039 Hypothyroidism, unspecified: Secondary | ICD-10-CM | POA: Insufficient documentation

## 2013-02-10 DIAGNOSIS — F17200 Nicotine dependence, unspecified, uncomplicated: Secondary | ICD-10-CM | POA: Insufficient documentation

## 2013-02-10 LAB — BASIC METABOLIC PANEL WITH GFR
BUN: 30 mg/dL — ABNORMAL HIGH (ref 6–23)
CO2: 25 meq/L (ref 19–32)
Calcium: 9 mg/dL (ref 8.4–10.5)
Chloride: 101 meq/L (ref 96–112)
Creatinine, Ser: 1.34 mg/dL — ABNORMAL HIGH (ref 0.50–1.10)
GFR calc Af Amer: 41 mL/min — ABNORMAL LOW (ref >90)
GFR calc non Af Amer: 36 mL/min — ABNORMAL LOW (ref >90)
Glucose, Bld: 105 mg/dL — ABNORMAL HIGH (ref 70–99)
Potassium: 4.1 meq/L (ref 3.5–5.1)
Sodium: 136 meq/L (ref 135–145)

## 2013-02-10 LAB — CBC WITH DIFFERENTIAL/PLATELET
Basophils Absolute: 0 K/uL (ref 0.0–0.1)
Basophils Relative: 0 % (ref 0–1)
Eosinophils Absolute: 0.1 K/uL (ref 0.0–0.7)
Eosinophils Relative: 1 % (ref 0–5)
HCT: 32.8 % — ABNORMAL LOW (ref 36.0–46.0)
Hemoglobin: 11.3 g/dL — ABNORMAL LOW (ref 12.0–15.0)
Lymphocytes Relative: 17 % (ref 12–46)
Lymphs Abs: 1.7 K/uL (ref 0.7–4.0)
MCH: 31.9 pg (ref 26.0–34.0)
MCHC: 34.5 g/dL (ref 30.0–36.0)
MCV: 92.7 fL (ref 78.0–100.0)
Monocytes Absolute: 1.2 K/uL — ABNORMAL HIGH (ref 0.1–1.0)
Monocytes Relative: 12 % (ref 3–12)
Neutro Abs: 6.9 K/uL (ref 1.7–7.7)
Neutrophils Relative %: 70 % (ref 43–77)
Platelets: 254 K/uL (ref 150–400)
RBC: 3.54 MIL/uL — ABNORMAL LOW (ref 3.87–5.11)
RDW: 14.2 % (ref 11.5–15.5)
WBC: 9.9 K/uL (ref 4.0–10.5)

## 2013-02-10 LAB — URINALYSIS, ROUTINE W REFLEX MICROSCOPIC
Bilirubin Urine: NEGATIVE
Protein, ur: NEGATIVE mg/dL
Urobilinogen, UA: 0.2 mg/dL (ref 0.0–1.0)

## 2013-02-10 LAB — URINE MICROSCOPIC-ADD ON

## 2013-02-10 MED ORDER — IBUPROFEN 800 MG PO TABS
800.0000 mg | ORAL_TABLET | Freq: Once | ORAL | Status: AC
  Administered 2013-02-10: 800 mg via ORAL
  Filled 2013-02-10: qty 1

## 2013-02-10 NOTE — ED Notes (Signed)
Per EMS - Pt had unwitnessed fall, came to deliver meal and found pt on floor. NO hematoma, abrasions, no loss of consciousness, no neck pain. Pt does acknowledge back pain and is alert, oriented, and communicative to her norm. Pt is portable DNR and hx of dementia from [facility] [place]. Pt is immoblized on short spine board w/ cervical collar w stable vital signs. BP - 124/70, HR - 60, Resp 16, no O2 SAT or CBG.

## 2013-02-10 NOTE — ED Notes (Signed)
Patient transported to CT 

## 2013-02-10 NOTE — ED Notes (Signed)
Bed:WHALA<BR> Expected date:<BR> Expected time:<BR> Means of arrival:<BR> Comments:<BR> fall

## 2013-02-10 NOTE — ED Provider Notes (Signed)
History     CSN: [id number]  Arrival date & time 02/10/13  1850   First MD Initiated Contact with Patient 02/10/13 2055      Chief Complaint  Patient presents with  . Fall    (Consider location/radiation/quality/duration/timing/severity/associated sxs/prior treatment) The history is provided by the EMS personnel and the nursing home.  LITHZY BERNARD is a 79 y.o. female hx of dementia, hypothyroidism here presenting with unwitnessed fall. Unwitnessed fall at the nursing home and she was found on the floor. Patient didn't remember how she fell she is on baby aspirin and no Coumadin or Plavix. Came in by EMS boarded and collared. Patient says she can't move the right leg as well but otherwise has no complaints.   Level V caveat- dementia    Past Medical History  Diagnosis Date  . Hypertension   . Hypothyroidism   . Mental disorder   . DEMENTIA   . Anxiety   . Chest pain 2013    No past surgical history on file.  Family History  Problem Relation Age of Onset  . Coronary aneurysm Father   . Heart failure Sister     History  Substance Use Topics  . Smoking status: Current Some Day Smoker -- 63 years  . Smokeless tobacco: Never Used  . Alcohol Use: No    OB History   Grav Para Term Preterm Abortions TAB SAB Ect Mult Living                  Review of Systems  Musculoskeletal:       R leg pain   All other systems reviewed and are negative.    Allergies  Aricept  Home Medications   Current Outpatient Rx  Name  Route  Sig  Dispense  Refill  . acetaminophen (TYLENOL) 325 MG tablet   Oral   Take 650 mg by mouth 2 (two) times daily.         Marland Kitchen alendronate (FOSAMAX) 35 MG tablet   Oral   Take 35 mg by mouth every Wednesday. Take with a full glass of water on an empty stomach.         Marland Kitchen aspirin EC 81 MG EC tablet   Oral   Take 1 tablet (81 mg total) by mouth daily.         . Calcium Carbonate-Vitamin D (CALCIUM 500+D PO)   Oral   Take 1 tablet by  mouth daily. 500/400         . feeding supplement (ENSURE COMPLETE) LIQD   Oral   Take 237 mLs by mouth 2 (two) times daily between meals as needed (suboptimal PO intake, underweight).         Marland Kitchen levothyroxine (SYNTHROID, LEVOTHROID) 50 MCG tablet   Oral   Take 50 mcg by mouth daily.         . Multiple Vitamins-Minerals (CERTAVITE SENIOR/ANTIOXIDANT PO)   Oral   Take 1 tablet by mouth daily.         . nitroGLYCERIN (NITROSTAT) 0.4 MG SL tablet   Sublingual   Place 1 tablet (0.4 mg total) under the tongue every 5 (five) minutes x 3 doses as needed for chest pain.   20 tablet   0     BP 153/64  Pulse 68  Temp(Src) 99 F (37.2 C) (Oral)  Resp 16  SpO2 97%  Physical Exam  Nursing note and vitals reviewed. Constitutional:  Chronically ill, nad   HENT:  Head:  Normocephalic and atraumatic.  Mouth/Throat: Oropharynx is clear and moist.  No obvious hematoma   Eyes: Conjunctivae are normal. Pupils are equal, round, and reactive to light.  Neck:  c collar in place   Cardiovascular: Normal rate, regular rhythm and normal heart sounds.   Pulmonary/Chest: Effort normal and breath sounds normal. No respiratory distress. She has no wheezes. She has no rales.  Abdominal: Soft. Bowel sounds are normal. She exhibits no distension. There is no tenderness. There is no rebound.  Musculoskeletal:  Dec ROM R hip. Mild tenderness on R femur. No obvious trauma or tenderness to other extremities. No spinal tenderness or step off   Neurological: She is alert.  Demented   Skin: Skin is warm and dry.  Psychiatric:  Unable     ED Course  Procedures (including critical care time)  Labs Reviewed  URINALYSIS, ROUTINE W REFLEX MICROSCOPIC - Abnormal; Notable for the following:    Hgb urine dipstick TRACE (*)    All other components within normal limits  BASIC METABOLIC PANEL - Abnormal; Notable for the following:    Glucose, Bld 105 (*)    BUN 30 (*)    Creatinine, Ser 1.34 (*)     GFR calc non Af Amer 36 (*)    GFR calc Af Amer 41 (*)    All other components within normal limits  CBC WITH DIFFERENTIAL - Abnormal; Notable for the following:    RBC 3.54 (*)    Hemoglobin 11.3 (*)    HCT 32.8 (*)    Monocytes Absolute 1.2 (*)    All other components within normal limits  URINE CULTURE  TROPONIN I  URINE MICROSCOPIC-ADD ON   Dg Chest 1 View  02/10/2013  *RADIOLOGY REPORT*  Clinical Data: Larey Seat.  CHEST - 1 VIEW  Comparison: 04/23/2012.  Findings: The cardiac silhouette, mediastinal and hilar contours are within normal limits and stable.  There is tortuosity and calcification of the thoracic aorta.  The lungs are clear. Apical scarring changes are stable.  No pleural effusion.  The bony thorax is intact.  IMPRESSION: No acute cardiopulmonary findings.   Original Report Authenticated By: Rudie Meyer, M.D.    Dg Pelvis 1-2 Views  02/10/2013  *RADIOLOGY REPORT*  Clinical Data: Larey Seat.  Right groin pain.  PELVIS - 1-2 VIEW  Comparison: None  Findings: There are inferior and superior pubic rami fractures on the right.  Both hips are normally located.  No hip fracture. The pubic symphysis and SI joints are intact.  IMPRESSION: Superior and inferior pubic rami fractures on the right.   Original Report Authenticated By: Rudie Meyer, M.D.    Dg Femur Right  02/10/2013  *RADIOLOGY REPORT*  Clinical Data: Larey Seat.  Right leg pain.  RIGHT FEMUR - 2 VIEW  Comparison: None  Findings: Superior and inferior pubic rami fractures are noted.  No femur fracture.  IMPRESSION: Superior and inferior pubic rami fractures.  No acute femur fracture.   Original Report Authenticated By: Rudie Meyer, M.D.    Ct Head Wo Contrast  02/10/2013  *RADIOLOGY REPORT*  Clinical Data:  Larey Seat.  Hit head.  CT HEAD WITHOUT CONTRAST CT CERVICAL SPINE WITHOUT CONTRAST  Technique:  Multidetector CT imaging of the head and cervical spine was performed following the standard protocol without intravenous contrast.  Multiplanar  CT image reconstructions of the cervical spine were also generated.  Comparison:  Head CT 01/23/2012.  CT HEAD  Findings: Stable age related cerebral atrophy, ventriculomegaly and periventricular white matter disease.  No extra-axial fluid collections are identified.  No CT findings for acute hemispheric infarction and/or intracranial hemorrhage.  The brainstem and cerebellum are grossly normal.  The bony structures are intact.  The visualized paranasal sinuses and mastoid air cells are clear.  The globes are intact.  IMPRESSION:  1.  Stable age related cerebral atrophy, ventriculomegaly and periventricular white matter disease. 2.  No acute intracranial findings or mass lesions. 3.  No acute skull fracture.  CT CERVICAL SPINE  Findings: Severe degenerative cervical spondylosis with multilevel disc disease and facet disease.  Degenerative subluxation at C5-6 and C6-7.  The facets are normally aligned.  Severe facet disease. No definite acute fracture or abnormal prevertebral soft tissue swelling.  The skull base C1 and C1-2 articulations are maintained. The dens is intact.  Moderate bony foraminal narrowing at C5-6 and C6-7.  The lung apices are grossly clear.  Scarring changes are noted.  IMPRESSION:  1.  Advanced degenerative cervical spondylosis with multilevel disc disease and facet disease. 2.  No acute cervical spine fracture.   Original Report Authenticated By: Rudie Meyer, M.D.    Ct Cervical Spine Wo Contrast  02/10/2013  *RADIOLOGY REPORT*  Clinical Data:  Larey Seat.  Hit head.  CT HEAD WITHOUT CONTRAST CT CERVICAL SPINE WITHOUT CONTRAST  Technique:  Multidetector CT imaging of the head and cervical spine was performed following the standard protocol without intravenous contrast.  Multiplanar CT image reconstructions of the cervical spine were also generated.  Comparison:  Head CT 01/23/2012.  CT HEAD  Findings: Stable age related cerebral atrophy, ventriculomegaly and periventricular white matter disease.   No extra-axial fluid collections are identified.  No CT findings for acute hemispheric infarction and/or intracranial hemorrhage.  The brainstem and cerebellum are grossly normal.  The bony structures are intact.  The visualized paranasal sinuses and mastoid air cells are clear.  The globes are intact.  IMPRESSION:  1.  Stable age related cerebral atrophy, ventriculomegaly and periventricular white matter disease. 2.  No acute intracranial findings or mass lesions. 3.  No acute skull fracture.  CT CERVICAL SPINE  Findings: Severe degenerative cervical spondylosis with multilevel disc disease and facet disease.  Degenerative subluxation at C5-6 and C6-7.  The facets are normally aligned.  Severe facet disease. No definite acute fracture or abnormal prevertebral soft tissue swelling.  The skull base C1 and C1-2 articulations are maintained. The dens is intact.  Moderate bony foraminal narrowing at C5-6 and C6-7.  The lung apices are grossly clear.  Scarring changes are noted.  IMPRESSION:  1.  Advanced degenerative cervical spondylosis with multilevel disc disease and facet disease. 2.  No acute cervical spine fracture.   Original Report Authenticated By: Rudie Meyer, M.D.      No diagnosis found.   Date: 02/10/2013  Rate: 67  Rhythm: normal sinus rhythm  QRS Axis: normal  Intervals: normal  ST/T Wave abnormalities: nonspecific ST changes  Conduction Disutrbances:none  Narrative Interpretation:   Old EKG Reviewed: unchanged    MDM  KAMIRA MELLETTE is a 79 y.o. female here with unwitnessed fall. Will get labs, ECG, trop. Will get R hip xray.   11:40 PM EKG unremarkable, trop neg x 1 and my suspicion of ACS is low. CT head/neck unremarkable so C collar cleared. Xray showed R superior and inferior rami fracture. Will d/c back to nursing home with ambulating with assistance and physical therapy.          Richardean Canal, MD 02/10/13 [id number]

## 2013-02-10 NOTE — ED Notes (Signed)
Bedside report received from previous RN, Shelley 

## 2013-02-11 NOTE — ED Notes (Signed)
[facility] called for transport.  

## 2013-02-11 NOTE — ED Notes (Signed)
Called [facility] back to give report to [employer], NT.

## 2013-02-11 NOTE — ED Notes (Signed)
Attempted to call report to [name]. They are unable to take the pt unless she can bear weight. Spoke with Dr. Saralyn Pilar. Attempted to have the pt stand and ambulate. Able to bear weight with no distress. Unable to amulate d/t pain and confusion. Will call [facility] back for report.

## 2013-02-12 LAB — URINE CULTURE
Colony Count: NO GROWTH
Culture: NO GROWTH

## 2013-02-14 ENCOUNTER — Emergency Department (HOSPITAL_COMMUNITY): Payer: Medicare Other

## 2013-02-14 ENCOUNTER — Emergency Department (HOSPITAL_COMMUNITY)
Admission: EM | Admit: 2013-02-14 | Discharge: 2013-02-15 | Disposition: A | Discharge status: Skilled Nursing Facility | Payer: Medicare Other | Attending: Emergency Medicine | Admitting: Emergency Medicine

## 2013-02-14 ENCOUNTER — Encounter (HOSPITAL_COMMUNITY): Payer: Self-pay | Admitting: *Deleted

## 2013-02-14 DIAGNOSIS — F411 Generalized anxiety disorder: Secondary | ICD-10-CM | POA: Insufficient documentation

## 2013-02-14 DIAGNOSIS — Z7982 Long term (current) use of aspirin: Secondary | ICD-10-CM | POA: Insufficient documentation

## 2013-02-14 DIAGNOSIS — F0390 Unspecified dementia without behavioral disturbance: Secondary | ICD-10-CM

## 2013-02-14 DIAGNOSIS — Z79899 Other long term (current) drug therapy: Secondary | ICD-10-CM | POA: Insufficient documentation

## 2013-02-14 DIAGNOSIS — I10 Essential (primary) hypertension: Secondary | ICD-10-CM | POA: Insufficient documentation

## 2013-02-14 DIAGNOSIS — S32509A Unspecified fracture of unspecified pubis, initial encounter for closed fracture: Secondary | ICD-10-CM | POA: Insufficient documentation

## 2013-02-14 DIAGNOSIS — Y939 Activity, unspecified: Secondary | ICD-10-CM | POA: Insufficient documentation

## 2013-02-14 DIAGNOSIS — E039 Hypothyroidism, unspecified: Secondary | ICD-10-CM | POA: Insufficient documentation

## 2013-02-14 DIAGNOSIS — Y929 Unspecified place or not applicable: Secondary | ICD-10-CM | POA: Insufficient documentation

## 2013-02-14 DIAGNOSIS — Z87448 Personal history of other diseases of urinary system: Secondary | ICD-10-CM | POA: Insufficient documentation

## 2013-02-14 DIAGNOSIS — F17200 Nicotine dependence, unspecified, uncomplicated: Secondary | ICD-10-CM | POA: Insufficient documentation

## 2013-02-14 DIAGNOSIS — S139XXA Sprain of joints and ligaments of unspecified parts of neck, initial encounter: Secondary | ICD-10-CM | POA: Insufficient documentation

## 2013-02-14 DIAGNOSIS — S0990XA Unspecified injury of head, initial encounter: Secondary | ICD-10-CM | POA: Insufficient documentation

## 2013-02-14 DIAGNOSIS — S161XXA Strain of muscle, fascia and tendon at neck level, initial encounter: Secondary | ICD-10-CM

## 2013-02-14 DIAGNOSIS — S329XXD Fracture of unspecified parts of lumbosacral spine and pelvis, subsequent encounter for fracture with routine healing: Secondary | ICD-10-CM

## 2013-02-14 DIAGNOSIS — W19XXXA Unspecified fall, initial encounter: Secondary | ICD-10-CM | POA: Insufficient documentation

## 2013-02-14 HISTORY — DX: Dysphagia, unspecified: R13.10

## 2013-02-14 HISTORY — DX: Unspecified speech disturbances: R47.9

## 2013-02-14 HISTORY — DX: Disorder of ligament, unspecified site: M24.20

## 2013-02-14 HISTORY — DX: Disorder of muscle, unspecified: M62.9

## 2013-02-14 HISTORY — DX: Disorder of kidney and ureter, unspecified: N28.9

## 2013-02-14 HISTORY — DX: Weakness: R53.1

## 2013-02-14 MED ORDER — FENTANYL CITRATE 0.05 MG/ML IJ SOLN
50.0000 ug | Freq: Once | INTRAMUSCULAR | Status: AC
  Administered 2013-02-14: 50 ug via NASAL
  Filled 2013-02-14: qty 2

## 2013-02-14 NOTE — ED Notes (Signed)
[id number]<JY> Expected date:<BR> Expected time:<BR> Means of arrival:<BR> Comments:<BR> EMS/fall-79 yo female

## 2013-02-14 NOTE — ED Notes (Signed)
Pt to ER via EMS after an unwitnessed fall; pt was seen on Sunday s/p fall and c/o pelvic pain; pt c/o of pelvic pain again today and c/o tenderness along spine'; pt arrives on LSB and C-Collar.

## 2013-02-14 NOTE — ED Provider Notes (Signed)
History     CSN: [id number]  Arrival date & time 02/14/13  2005   First MD Initiated Contact with Patient 02/14/13 2125      Chief Complaint  Patient presents with  . Fall  . 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 She gives an alternating history because it sometimes she states she is no headache no neck pain and then sometimes that she does have headache neck pain, she consistently says she has low back pain and pelvis pain for several days but has no idea why she is here, where she is, or what happened today and has no idea if she fell today. She is an unreliable historian. She denies chest pain shortness breath abdominal pain and moves all 4 extremities well with no obvious lateralizing weakness. Treatment prior to arrival consisted a cervical collar. Past Medical History  Diagnosis Date  . Hypertension   . Hypothyroidism   . Mental disorder   . DEMENTIA   . Anxiety   . Chest pain 2013  . Weakness   . Dysphagia   . Muscle/ligament disorder   . Speech disorder   . Renal disorder     Past Surgical History  Procedure Laterality Date  . Skin graft      to left leg    Family History  Problem Relation Age of Onset  . Coronary aneurysm Father   . Heart failure Sister     History  Substance Use Topics  . Smoking status: Current Some Day Smoker -- 63 years  . Smokeless tobacco: Never Used  . Alcohol Use: No    OB History   Grav Para Term Preterm Abortions TAB SAB Ect Mult Living                  Review of Systems 10 Systems reviewed and are  negative for acute change except as noted in the HPI. Allergies  Aricept  Home Medications   Current Outpatient Rx  Name  Route  Sig  Dispense  Refill  . acetaminophen (TYLENOL) 325 MG tablet   Oral   Take 650 mg by mouth 2 (two) times daily.         Marland Kitchen aspirin EC 81 MG EC tablet   Oral   Take 1 tablet (81 mg total) by mouth daily.         . Calcium Carbonate-Vitamin D (CALCIUM 500+D PO)   Oral   Take 1 tablet by mouth daily.          . feeding supplement (ENSURE IMMUNE HEALTH) LIQD   Oral   Take 237 mLs by mouth 2 (two) times daily.         Marland Kitchen levothyroxine (SYNTHROID, LEVOTHROID) 50 MCG tablet   Oral   Take 50 mcg by mouth daily.         . Multiple Vitamin (MULTIVITAMIN WITH MINERALS) TABS   Oral   Take 1 tablet by mouth daily.         . nitroGLYCERIN (NITROSTAT) 0.4 MG SL tablet   Sublingual   Place 1 tablet (0.4 mg total) under the  tongue every 5 (five) minutes x 3 doses as needed for chest pain.   20 tablet   0   . traMADol (ULTRAM) 50 MG tablet   Oral   Take 25 mg by mouth 3 (three) times daily. 14 day course of therapy started 02/13/13.         Marland Kitchen alendronate (FOSAMAX) 35 MG tablet   Oral   Take 35 mg by mouth every 7 (seven) days. Take with a full glass of water on an empty stomach.  Taken on Fridays.           BP 127/46  Pulse 58  Temp(Src) 98.2 F (36.8 C) (Oral)  Resp 19  SpO2 96%  Physical Exam  Nursing note and vitals reviewed. Constitutional:  Awake, alert, nontoxic appearance with baseline speech for patient.  HENT:  Head: Atraumatic.  Mouth/Throat: No oropharyngeal exudate.  Eyes: EOM are normal. Pupils are equal, round, and reactive to light. Right eye exhibits no discharge. Left eye exhibits no discharge.  Neck: Neck supple.  Cervical spine and posterior neck have no consistent posterior tenderness (she is intermittently tender if at all over the cervical spine region) and the patient appears to be able to rotate her neck  45 both left and right without difficulty at times but at other times says it hurts a bit  Cardiovascular: Normal rate and regular rhythm.   No murmur heard. Pulmonary/Chest: Effort normal and breath sounds normal. No stridor. No respiratory distress. She has no wheezes. She has no rales. She exhibits no tenderness.  Abdominal: Soft. Bowel sounds are normal. She exhibits no mass. There is no tenderness. There is no rebound.  Musculoskeletal: She exhibits tenderness. She exhibits no edema.  Baseline ROM, moves extremities with no obvious new focal weakness. She has diffuse mild lumbar tenderness and diffuse bilateral pelvic and bilateral hip tenderness with good active range of motion at her hips with good active flexion to 90 with no tenderness to her knees ankles and feet.  Lymphadenopathy:    She has no cervical adenopathy.  Neurological: She is alert.  Awake, alert, cooperative calm pleasant smiling oriented to person only; motor strength 4/5 bilaterally; sensation normal to light touch bilaterally; peripheral visual fields full to confrontation; no facial asymmetry; tongue midline; major cranial nerves appear intact; no pronator drift, normal finger to nose bilaterally  Skin: No rash noted.  Psychiatric: She has a normal mood and affect.    ED Course  Procedures (including critical care time)  Labs Reviewed - No data to display Ct Head Wo Contrast  02/14/2013  *RADIOLOGY REPORT*  Clinical Data:  Larey Seat.  Hit head.  CT HEAD WITHOUT CONTRAST CT CERVICAL SPINE WITHOUT CONTRAST  Technique:  Multidetector CT imaging of the head and cervical spine was performed following the standard protocol without intravenous contrast.  Multiplanar CT image reconstructions of the cervical spine were also generated.  Comparison:  02/10/2013  CT HEAD  Findings: Stable age related cerebral atrophy, ventriculomegaly and periventricular white matter disease.  No extra-axial fluid collections are identified.  No CT  findings for acute hemispheric infarction and/or intracranial hemorrhage.  No mass lesions.  The brainstem and cerebellum grossly normal and stable.  No acute skull fracture.  The paranasal sinuses mastoid air cells are clear.  IMPRESSION:  1.  Stable age related cerebral atrophy, ventriculomegaly and periventricular white matter disease. 2.  No acute intracranial findings or acute skull fracture.  CT CERVICAL SPINE  Findings: Stable severe degenerative cervical spondylosis with  multilevel disc disease and facet disease.  Degenerative posterior subluxation of C5 is stable.  There is also stable anterior subluxation of C3.  No acute fracture or abnormal prevertebral soft tissue swelling.  The skull base C1 and C1-2 articulations are maintained.  The lung apices are grossly clear.  Stable apical scarring changes.  IMPRESSION: Advanced degenerative cervical spondylosis with multilevel disc disease and facet disease.  No acute fracture and no change since prior examination.   Original Report Authenticated By: Rudie Meyer, M.D.    Ct Cervical Spine Wo Contrast  02/14/2013  *RADIOLOGY REPORT*  Clinical Data:  Larey Seat.  Hit head.  CT HEAD WITHOUT CONTRAST CT CERVICAL SPINE WITHOUT CONTRAST  Technique:  Multidetector CT imaging of the head and cervical spine was performed following the standard protocol without intravenous contrast.  Multiplanar CT image reconstructions of the cervical spine were also generated.  Comparison:  02/10/2013  CT HEAD  Findings: Stable age related cerebral atrophy, ventriculomegaly and periventricular white matter disease.  No extra-axial fluid collections are identified.  No CT findings for acute hemispheric infarction and/or intracranial hemorrhage.  No mass lesions.  The brainstem and cerebellum grossly normal and stable.  No acute skull fracture.  The paranasal sinuses mastoid air cells are clear.  IMPRESSION:  1.  Stable age related cerebral atrophy, ventriculomegaly and periventricular  white matter disease. 2.  No acute intracranial findings or acute skull fracture.  CT CERVICAL SPINE  Findings: Stable severe degenerative cervical spondylosis with multilevel disc disease and facet disease.  Degenerative posterior subluxation of C5 is stable.  There is also stable anterior subluxation of C3.  No acute fracture or abnormal prevertebral soft tissue swelling.  The skull base C1 and C1-2 articulations are maintained.  The lung apices are grossly clear.  Stable apical scarring changes.  IMPRESSION: Advanced degenerative cervical spondylosis with multilevel disc disease and facet disease.  No acute fracture and no change since prior examination.   Original Report Authenticated By: Rudie Meyer, M.D.    Ct Lumbar Spine Wo Contrast  02/14/2013  *RADIOLOGY REPORT*  Clinical Data: Larey Seat.  Back pain.  CT LUMBAR SPINE WITHOUT CONTRAST  Technique:  Multidetector CT imaging of the lumbar spine was performed without intravenous contrast administration. Multiplanar CT image reconstructions were also generated.  Comparison: None.  Findings: Moderate degenerative lumbar spondylosis with multilevel disc disease and facet disease.  Early significant degenerative disease at L1-2 and L2-3.  No acute fractures identified.  The facets are normally aligned.  No pars defects.  Aortic calcifications are noted without focal aneurysm.  Left renal calculi are noted in the left kidney is small.  No obvious large disc protrusions.  Diffuse bulging annulus at L4-5 and L3-4.  IMPRESSION:  1.  No acute lumbar spine fracture. 2.  Degenerative lumbar spondylosis with multilevel disc disease and facet disease.   Original Report Authenticated By: Rudie Meyer, M.D.    Ct Pelvis Wo Contrast  02/14/2013  *RADIOLOGY REPORT*  Clinical Data: Larey Seat.  Pelvic pain.  CT PELVIS WITHOUT CONTRAST  Technique:  Multidetector CT imaging of the pelvis was performed following the standard protocol without intravenous contrast.  Comparison: Pelvic  radiograph 02/10/2013.  Findings: As demonstrated on the plain films there are superior and inferior pubic rami fractures on the right side.  No other fractures are identified.  The pubic symphysis and SI joints are intact.  No definite sacral fractures.  There is a small hematoma associated with the right pelvic fractures.  No intrapelvic bladder  fluid.  The bladder is normal.  Both hips are normally located.  No hip fracture.  IMPRESSION: Stable superior and inferior pubic rami fractures on the right. No hip fracture.   Original Report Authenticated By: Rudie Meyer, M.D.      1. Minor head injury, initial encounter   2. Cervical strain, acute, initial encounter   3. Fall, initial encounter   4. Dementia   5. Pelvic fracture, with routine healing, subsequent encounter       MDM  Pt stable in ED with no significant deterioration in condition. I doubt any other EMC precluding discharge at this time including, but not necessarily limited to the following:[facility].       Hurman Horn, MD 02/15/13 2149

## 2013-02-17 ENCOUNTER — Emergency Department (HOSPITAL_COMMUNITY): Payer: Medicare Other

## 2013-02-17 ENCOUNTER — Emergency Department (HOSPITAL_COMMUNITY)
Admission: EM | Admit: 2013-02-17 | Discharge: 2013-02-17 | Disposition: A | Discharge status: Home / Self Care | Payer: Medicare Other | Attending: Emergency Medicine | Admitting: Emergency Medicine

## 2013-02-17 DIAGNOSIS — Z8659 Personal history of other mental and behavioral disorders: Secondary | ICD-10-CM | POA: Insufficient documentation

## 2013-02-17 DIAGNOSIS — F0390 Unspecified dementia without behavioral disturbance: Secondary | ICD-10-CM | POA: Insufficient documentation

## 2013-02-17 DIAGNOSIS — E039 Hypothyroidism, unspecified: Secondary | ICD-10-CM | POA: Insufficient documentation

## 2013-02-17 DIAGNOSIS — Z79899 Other long term (current) drug therapy: Secondary | ICD-10-CM | POA: Insufficient documentation

## 2013-02-17 DIAGNOSIS — Y9389 Activity, other specified: Secondary | ICD-10-CM | POA: Insufficient documentation

## 2013-02-17 DIAGNOSIS — Y9210 Unspecified residential institution as the place of occurrence of the external cause: Secondary | ICD-10-CM | POA: Insufficient documentation

## 2013-02-17 DIAGNOSIS — S62639B Displaced fracture of distal phalanx of unspecified finger, initial encounter for open fracture: Secondary | ICD-10-CM | POA: Insufficient documentation

## 2013-02-17 DIAGNOSIS — R911 Solitary pulmonary nodule: Secondary | ICD-10-CM | POA: Insufficient documentation

## 2013-02-17 DIAGNOSIS — I10 Essential (primary) hypertension: Secondary | ICD-10-CM | POA: Insufficient documentation

## 2013-02-17 DIAGNOSIS — F411 Generalized anxiety disorder: Secondary | ICD-10-CM | POA: Insufficient documentation

## 2013-02-17 DIAGNOSIS — W268XXA Contact with other sharp object(s), not elsewhere classified, initial encounter: Secondary | ICD-10-CM | POA: Insufficient documentation

## 2013-02-17 DIAGNOSIS — Z87448 Personal history of other diseases of urinary system: Secondary | ICD-10-CM | POA: Insufficient documentation

## 2013-02-17 DIAGNOSIS — F17200 Nicotine dependence, unspecified, uncomplicated: Secondary | ICD-10-CM | POA: Insufficient documentation

## 2013-02-17 LAB — URINALYSIS, ROUTINE W REFLEX MICROSCOPIC
Bilirubin Urine: NEGATIVE
Ketones, ur: NEGATIVE mg/dL
Nitrite: NEGATIVE
Protein, ur: NEGATIVE mg/dL
Urobilinogen, UA: 0.2 mg/dL (ref 0.0–1.0)

## 2013-02-17 LAB — POCT I-STAT, CHEM 8
Calcium, Ion: 1.21 mmol/L (ref 1.13–1.30)
Chloride: 105 mEq/L (ref 96–112)
Glucose, Bld: 117 mg/dL — ABNORMAL HIGH (ref 70–99)
HCT: 32 % — ABNORMAL LOW (ref 36.0–46.0)
Hemoglobin: 10.9 g/dL — ABNORMAL LOW (ref 12.0–15.0)
TCO2: 28 mmol/L (ref 0–100)

## 2013-02-17 MED ORDER — CEPHALEXIN 250 MG PO CAPS: 250.0000 mg | ORAL_CAPSULE | Freq: Four times a day (QID) | ORAL | Status: DC

## 2013-02-17 MED ORDER — TETANUS-DIPHTH-ACELL PERTUSSIS 5-2.5-18.5 LF-MCG/0.5 IM SUSP
0.5000 mL | Freq: Once | INTRAMUSCULAR | Status: AC
  Administered 2013-02-17: 0.5 mL via INTRAMUSCULAR
  Filled 2013-02-17: qty 0.5

## 2013-02-17 MED ORDER — CEPHALEXIN 500 MG PO CAPS
500.0000 mg | ORAL_CAPSULE | Freq: Once | ORAL | Status: AC
  Administered 2013-02-17: 500 mg via ORAL
  Filled 2013-02-17: qty 1

## 2013-02-17 MED ORDER — CEPHALEXIN 500 MG PO CAPS: 500.0000 mg | ORAL_CAPSULE | Freq: Four times a day (QID) | ORAL | Status: DC

## 2013-02-17 MED ORDER — HYDROCODONE-ACETAMINOPHEN 5-325 MG PO TABS
1.0000 | ORAL_TABLET | Freq: Once | ORAL | Status: AC
  Administered 2013-02-17: 1 via ORAL
  Filled 2013-02-17: qty 1

## 2013-02-17 NOTE — ED Provider Notes (Signed)
Medical screening examination/treatment/procedure(s) were conducted as a shared visit with non-physician practitioner(s) and myself.  I personally evaluated the patient during the encounter Feldstein Skene, M.D. Level V caveat for dementia - pt is extremely poor historian, history per NH report and EMS  [name] is a 79 y.o. female who is alert and oriented to herself only, presents with laceration to finger, found with a broken lightbulb at [facility].  Pt has h/o frequent falls.  Has Left thigh, hip pain, mild to moderate, in no distress, does not radiate.  Denies chest pain or shortness of breath.  ZOX:WRUEA V caveat 2/2 dementia  VITAL SIGNS:   Filed Vitals:   02/17/13 1055  BP: 133/72  Pulse: 61  Temp:   Resp: 16   CONSTITUTIONAL: Awake, oriented to self only, appears non-toxic, no distress, scattered small blood spots on clothing. HENT: Atraumatic, normocephalic, oral mucosa pink and moist, airway patent. Nares patent without drainage. External ears normal. EYES: Conjunctiva clear, EOMI, PERRLA NECK: Trachea midline, non-tender, supple CARDIOVASCULAR: Normal heart rate, Normal rhythm, No murmurs, rubs, gallops PULMONARY/CHEST: Clear to auscultation, no rhonchi, wheezes, or rales. Symmetrical breath sounds. Non-tender. ABDOMINAL: Non-distended, soft, non-tender - no rebound or guarding.  BS normal. NEUROLOGIC: Non-focal, moving all four extremities, no gross sensory or motor deficits. EXTREMITIES: No clubbing, cyanosis, or edema. Laceration to 4th digit of left hand, nail is loose to 1/2 nail bed.  Minimal TTP.  Sensation intact and cap refill in the 4th digit is <2 sec. Right hip TTP over greater trochanter without obvious bruising.  SKIN: Warm, Dry, No erythema, No rash  MDM: PT with finger lac, agreed with PA obtaining scans of head and neck - pt suffered a fall - getting lightbulb? Pt unable to describe context of her injuries.  XR and CT shows old pelvic Fx, tuft Fx of 4th digit -  PA sutured.  Keflex for open tuft fracture.  Hand sx follow up. Admission not indicated. Labs at baseline.     Gregory Skene, MD 02/17/13 1215

## 2013-02-17 NOTE — ED Notes (Addendum)
Per EMS, pt from [place]. No one was sure how the pt cut her left ring finger; however, EMS found a broken light bulb on the floor. Pt has hx of dementia.

## 2013-02-17 NOTE — ED Notes (Signed)
Pt resting in bed at this time. Pt soaking forth digit on right hand.

## 2013-02-17 NOTE — ED Notes (Signed)
Patient transported to CT 

## 2013-02-17 NOTE — ED Notes (Signed)
Report given to [facility] at [facility].

## 2013-02-17 NOTE — ED Notes (Signed)
Patient transported to X-ray 

## 2013-02-17 NOTE — ED Notes (Signed)
Bed:WA19<BR> Expected date:<BR> Expected time:<BR> Means of arrival:<BR> Comments:<BR> EMS

## 2013-02-17 NOTE — ED Notes (Signed)
[facility] notified of transfer back to facility.

## 2013-02-17 NOTE — ED Provider Notes (Signed)
History     CSN: [id number]  Arrival date & time 02/17/13  0519   First MD Initiated Contact with Patient 02/17/13 0602      Chief Complaint  Patient presents with  . Extremity Laceration    (Consider location/radiation/quality/duration/timing/severity/associated sxs/prior treatment) HPI Comments: Patient with hx dementia, brought in from nursing home after being found with laceration of left 4th finger.  EMS states there was a broken lightbulb in the room with the patient.  Pt was upright when she was found, there was no concern or evidence of a fall.  Pt has severe dementia.  She is unable to comment on how the laceration occurred.  She denies any pain.    Level V caveat for dementia.    The history is provided by the nursing home and the EMS personnel. The history is limited by the condition of the patient (dementia).    Past Medical History  Diagnosis Date  . Hypertension   . Hypothyroidism   . Mental disorder   . DEMENTIA   . Anxiety   . Chest pain 2013  . Weakness   . Dysphagia   . Muscle/ligament disorder   . Speech disorder   . Renal disorder     Past Surgical History  Procedure Laterality Date  . Skin graft      to left leg    Family History  Problem Relation Age of Onset  . Coronary aneurysm Father   . Heart failure Sister     History  Substance Use Topics  . Smoking status: Current Some Day Smoker -- 63 years  . Smokeless tobacco: Never Used  . Alcohol Use: No    OB History   Grav Para Term Preterm Abortions TAB SAB Ect Mult Living                  Review of Systems  Unable to perform ROS: Dementia    Allergies  Aricept  Home Medications   Current Outpatient Rx  Name  Route  Sig  Dispense  Refill  . acetaminophen (TYLENOL) 325 MG tablet   Oral   Take 650 mg by mouth 2 (two) times daily as needed. For pain         . aspirin EC 81 MG EC tablet   Oral   Take 1 tablet (81 mg total) by mouth daily.         . Calcium  Carbonate-Vitamin D (CALCIUM 500+D PO)   Oral   Take 1 tablet by mouth daily.          . feeding supplement (ENSURE IMMUNE HEALTH) LIQD   Oral   Take 237 mLs by mouth 2 (two) times daily.         Marland Kitchen levothyroxine (SYNTHROID, LEVOTHROID) 50 MCG tablet   Oral   Take 50 mcg by mouth daily.         . Multiple Vitamin (MULTIVITAMIN WITH MINERALS) TABS   Oral   Take 1 tablet by mouth daily.         . traMADol (ULTRAM) 50 MG tablet   Oral   Take 25 mg by mouth 3 (three) times daily. 14 day course of therapy started 02/13/13.         Marland Kitchen alendronate (FOSAMAX) 35 MG tablet   Oral   Take 35 mg by mouth every 7 (seven) days. Take with a full glass of water on an empty stomach.  Taken on Fridays.         Marland Kitchen  nitroGLYCERIN (NITROSTAT) 0.4 MG SL tablet   Sublingual   Place 1 tablet (0.4 mg total) under the tongue every 5 (five) minutes x 3 doses as needed for chest pain.   20 tablet   0     BP 103/82  Pulse 68  Temp(Src) 98.6 F (37 C) (Oral)  Resp 16  SpO2 94%  Physical Exam  Nursing note and vitals reviewed. Constitutional: She appears well-developed and well-nourished. No distress.  HENT:  Head: Normocephalic and atraumatic.  Neck: Neck supple.  Cardiovascular: Normal rate and regular rhythm.   Pulmonary/Chest: Effort normal and breath sounds normal. No respiratory distress. She has no wheezes. She has no rales.  Abdominal: Soft. She exhibits no distension. There is no tenderness. There is no rebound and no guarding.  Musculoskeletal: Normal range of motion. She exhibits no edema and no tenderness.  Spine without localized tenderness, no crepitus or stepoffs.   Neurological: She is alert.  Skin: She is not diaphoretic.  Left hand, 4th finger, laceration distal tuft, hemostatic.  Finger is nontender.  Capillary refill < 2 seconds, sensation intact.     ED Course  Procedures (including critical care time)  Labs Reviewed  URINALYSIS, ROUTINE W REFLEX MICROSCOPIC -  Abnormal; Notable for the following:    Hgb urine dipstick TRACE (*)    All other components within normal limits  POCT I-STAT, CHEM 8 - Abnormal; Notable for the following:    BUN 33 (*)    Creatinine, Ser 1.50 (*)    Glucose, Bld 117 (*)    Hemoglobin 10.9 (*)    HCT 32.0 (*)    All other components within normal limits  URINE CULTURE  URINE MICROSCOPIC-ADD ON   Dg Chest 2 View  02/17/2013  *RADIOLOGY REPORT*  Clinical Data: Fall, confusion, pain  CHEST - 2 VIEW  Comparison: 02/10/2013  Findings: Normal heart size and vascularity.  Hyperinflation noted with parenchymal mild scarring compatible with background COPD/emphysema.  No focal pneumonia, collapse, consolidation, effusion, edema, or pneumothorax.  10 mm right midlung nodular density noted, new since the prior study.  This could represent superimposed shadows versus a developing right midlung nodule. Recommend follow-up non emergent chest CT.  IMPRESSION: COPD/emphysema  No acute CHF or pneumonia  10 mm right mid lung nodule warrants further evaluation as above   Original Report Authenticated By: Judie Petit. Miles Costain, M.D.    Dg Pelvis 1-2 Views  02/17/2013  *RADIOLOGY REPORT*  Clinical Data: Fall, right pelvic pain  PELVIS - 1-2 VIEW  Comparison: 02/10/2013, 02/14/2013  Findings: Bones are osteopenic.  Acute nondisplaced fractures present of the right superior and inferior rami as before.  No diastasis.  Left rami intact.  Hips are symmetric and located. Nonobstructive bowel gas pattern.  IMPRESSION: Redemonstration of acute right superior and inferior pubic rami fractures.  Stable appearance   Original Report Authenticated By: Judie Petit. Miles Costain, M.D.    Ct Head Wo Contrast  02/17/2013  *RADIOLOGY REPORT*  Clinical Data:  Unwitnessed fall.  CT HEAD WITHOUT CONTRAST CT CERVICAL SPINE WITHOUT CONTRAST  Technique:  Multidetector CT imaging of the head and cervical spine was performed following the standard protocol without intravenous contrast.  Multiplanar CT  image reconstructions of the cervical spine were also generated.  Comparison:  CT 02/14/2013  CT HEAD  Findings: Stable appearance of the cerebral atrophy.  Stable appearance of the low density in the periventricular and subcortical white matter suggesting chronic change.  No evidence for acute hemorrhage, mass lesion, midline shift,  hydrocephalus or large infarct.  The visualized sinuses are clear.  No evidence for a calvarial fracture.  IMPRESSION: No acute intracranial abnormality.  Stable appearance of the cerebral atrophy and evidence for chronic small vessel ischemic changes.  CT CERVICAL SPINE  Findings: No evidence for soft tissue swelling within the neck. Scarring at the lung apices without pneumothorax.  Multilevel degenerative changes throughout the cervical spine.  No evidence for acute fracture or dislocation.  There is stable anterolisthesis at C4-C5.  Severe disc space loss at C5-C6 and C6-C7.  Stable mild anterolisthesis at C7-T1.  Stable retrolisthesis at C5-C6. Multilevel level facet degenerative changes.  IMPRESSION: Severe multilevel cervical spondylosis, particularly at C5-C7.  No change since the previous examination and no evidence for an acute bony abnormality.   Original Report Authenticated By: Richarda Overlie, M.D.    Ct Cervical Spine Wo Contrast  02/17/2013  *RADIOLOGY REPORT*  Clinical Data:  Unwitnessed fall.  CT HEAD WITHOUT CONTRAST CT CERVICAL SPINE WITHOUT CONTRAST  Technique:  Multidetector CT imaging of the head and cervical spine was performed following the standard protocol without intravenous contrast.  Multiplanar CT image reconstructions of the cervical spine were also generated.  Comparison:  CT 02/14/2013  CT HEAD  Findings: Stable appearance of the cerebral atrophy.  Stable appearance of the low density in the periventricular and subcortical white matter suggesting chronic change.  No evidence for acute hemorrhage, mass lesion, midline shift, hydrocephalus or large infarct.   The visualized sinuses are clear.  No evidence for a calvarial fracture.  IMPRESSION: No acute intracranial abnormality.  Stable appearance of the cerebral atrophy and evidence for chronic small vessel ischemic changes.  CT CERVICAL SPINE  Findings: No evidence for soft tissue swelling within the neck. Scarring at the lung apices without pneumothorax.  Multilevel degenerative changes throughout the cervical spine.  No evidence for acute fracture or dislocation.  There is stable anterolisthesis at C4-C5.  Severe disc space loss at C5-C6 and C6-C7.  Stable mild anterolisthesis at C7-T1.  Stable retrolisthesis at C5-C6. Multilevel level facet degenerative changes.  IMPRESSION: Severe multilevel cervical spondylosis, particularly at C5-C7.  No change since the previous examination and no evidence for an acute bony abnormality.   Original Report Authenticated By: Richarda Overlie, M.D.    Dg Femur Left Port  02/17/2013  *RADIOLOGY REPORT*  Clinical Data: Fall and holding the left femur.  PORTABLE LEFT FEMUR - 2 VIEW  Comparison: None.  Findings: Two views of the left femur were obtained.  The visualized pelvic ring is intact.  The left hip and knee are located.  No evidence for a fracture.  IMPRESSION: No acute abnormality.   Original Report Authenticated By: Richarda Overlie, M.D.    Dg Hand Complete Left  02/17/2013  *RADIOLOGY REPORT*  Clinical Data: Fall, pain, bruising  LEFT HAND - COMPLETE 3+ VIEW  Comparison: None.  Findings: Bones are osteopenic.  Left fourth digit soft tissue injury distally.  Beneath this, there is a small fracture of the fourth digit distal phalangeal tuft.  No radiopaque foreign body.  IMPRESSION: Fourth digit distal phalangeal tuft fracture   Original Report Authenticated By: Judie Petit. Miles Costain, M.D.     6:24 AM Pt discussed with Dr Rulon Abide.   7:25 AM Pt with distal tuft fracture.  Though there was no concern or evidence of fall initially, will treat as fall given fracture.  Dr Rulon Abide to see patient.   Dr  Rulon Abide has also seen patient.  Discussed plan including wound repair.  Will give PO keflex, suture wound, hand f/u.    LACERATION REPAIR Performed by: Trixie Dredge B Authorized by: Trixie Dredge B Consent: Verbal consent obtained. Risks and benefits: risks, benefits and alternatives were discussed Consent given by: patient Patient identity confirmed: provided demographic data Prepped and Draped in normal sterile fashion Wound explored  Laceration Location: left 4th finger  Laceration Length: 1.5cm  No Foreign Bodies seen or palpated  Anesthesia: digital block Local anesthetic: lidocaine 2% no epinephrine  Anesthetic total: 4 ml  Irrigation method: syringe Amount of cleaning: standard  Skin closure: 6-0 ethilon  Number of sutures: 3  Technique: simple interrupted.   Patient tolerance: Patient tolerated the procedure well with no immediate complications.   1. Open fracture of distal phalangeal tuft, initial encounter   2. Pulmonary nodule seen on imaging study     MDM  Elderly patient with dementia brought in for lac to left finger with broken glass found in room.  No evidence of fall per nursing home and EMS.  Pt is alert and apparently at her baseline.   Given fracture, added multiple xrays and CTs, labs and UA, concern for possible unwitnessed fall.  Xrays show old fractures, nothing new.  UA and labs unremarkable.  Renal function and Hgb approximately baseline.  Pt continues to deny pain.  Finger laceration repaired in ED, will need close hand follow up.  Keflex started in ED, d/c home with same.  Pt d/c home with return precautions.         Trixie Dredge, PA-C 02/17/13 1030

## 2013-02-18 LAB — URINE CULTURE
Culture: NO GROWTH
Special Requests: NORMAL

## 2013-02-21 ENCOUNTER — Emergency Department (HOSPITAL_COMMUNITY)
Admission: EM | Admit: 2013-02-21 | Discharge: 2013-02-22 | Disposition: A | Discharge status: Skilled Nursing Facility | Payer: Medicare Other | Attending: Emergency Medicine | Admitting: Emergency Medicine

## 2013-02-21 ENCOUNTER — Emergency Department (HOSPITAL_COMMUNITY): Payer: Medicare Other

## 2013-02-21 ENCOUNTER — Encounter (HOSPITAL_COMMUNITY): Payer: Self-pay | Admitting: *Deleted

## 2013-02-21 DIAGNOSIS — Z8669 Personal history of other diseases of the nervous system and sense organs: Secondary | ICD-10-CM | POA: Insufficient documentation

## 2013-02-21 DIAGNOSIS — Y939 Activity, unspecified: Secondary | ICD-10-CM | POA: Insufficient documentation

## 2013-02-21 DIAGNOSIS — Z8739 Personal history of other diseases of the musculoskeletal system and connective tissue: Secondary | ICD-10-CM | POA: Insufficient documentation

## 2013-02-21 DIAGNOSIS — M549 Dorsalgia, unspecified: Secondary | ICD-10-CM

## 2013-02-21 DIAGNOSIS — Y9210 Unspecified residential institution as the place of occurrence of the external cause: Secondary | ICD-10-CM | POA: Insufficient documentation

## 2013-02-21 DIAGNOSIS — IMO0002 Reserved for concepts with insufficient information to code with codable children: Secondary | ICD-10-CM | POA: Insufficient documentation

## 2013-02-21 DIAGNOSIS — E039 Hypothyroidism, unspecified: Secondary | ICD-10-CM | POA: Insufficient documentation

## 2013-02-21 DIAGNOSIS — Z8679 Personal history of other diseases of the circulatory system: Secondary | ICD-10-CM | POA: Insufficient documentation

## 2013-02-21 DIAGNOSIS — I10 Essential (primary) hypertension: Secondary | ICD-10-CM | POA: Insufficient documentation

## 2013-02-21 DIAGNOSIS — Z8659 Personal history of other mental and behavioral disorders: Secondary | ICD-10-CM | POA: Insufficient documentation

## 2013-02-21 DIAGNOSIS — Z87448 Personal history of other diseases of urinary system: Secondary | ICD-10-CM | POA: Insufficient documentation

## 2013-02-21 DIAGNOSIS — F0390 Unspecified dementia without behavioral disturbance: Secondary | ICD-10-CM | POA: Insufficient documentation

## 2013-02-21 DIAGNOSIS — W19XXXA Unspecified fall, initial encounter: Secondary | ICD-10-CM

## 2013-02-21 DIAGNOSIS — S7000XA Contusion of unspecified hip, initial encounter: Secondary | ICD-10-CM

## 2013-02-21 DIAGNOSIS — Z8781 Personal history of (healed) traumatic fracture: Secondary | ICD-10-CM | POA: Insufficient documentation

## 2013-02-21 DIAGNOSIS — F17200 Nicotine dependence, unspecified, uncomplicated: Secondary | ICD-10-CM | POA: Insufficient documentation

## 2013-02-21 DIAGNOSIS — Z7982 Long term (current) use of aspirin: Secondary | ICD-10-CM | POA: Insufficient documentation

## 2013-02-21 DIAGNOSIS — Z79899 Other long term (current) drug therapy: Secondary | ICD-10-CM | POA: Insufficient documentation

## 2013-02-21 DIAGNOSIS — Z9181 History of falling: Secondary | ICD-10-CM | POA: Insufficient documentation

## 2013-02-21 MED ORDER — ACETAMINOPHEN 325 MG PO TABS
650.0000 mg | ORAL_TABLET | Freq: Once | ORAL | Status: AC
  Administered 2013-02-21: 650 mg via ORAL
  Filled 2013-02-21: qty 2

## 2013-02-21 NOTE — ED Notes (Signed)
Stitches noted on pt's left ring finger.  Some signs of infection noted.

## 2013-02-21 NOTE — ED Provider Notes (Signed)
History     CSN: [id number]  Arrival date & time 02/21/13  2027   First MD Initiated Contact with Patient 02/21/13 2046      No chief complaint on file.   (Consider location/radiation/quality/duration/timing/severity/associated sxs/prior treatment) HPI Comments: Level 5 caveat due to dementia.  Pt is brought by EMS due to apparently unwitnessed fall, reportedly complained of left hip.  Pt had priro fall and diagnosed with pelvic rami fracture.  She has prior h/o multiple falls.  Pt is DNR by prior records.  Pt denies any CP, SOB.  She denies nausea.  She does report some back pain, but no neck pain or headache.  She thinks she may have tripped on something.    The history is provided by the patient and medical records.    Past Medical History  Diagnosis Date  . Hypertension   . Hypothyroidism   . Mental disorder   . DEMENTIA   . Anxiety   . Chest pain 2013  . Weakness   . Dysphagia   . Muscle/ligament disorder   . Speech disorder   . Renal disorder     Past Surgical History  Procedure Laterality Date  . Skin graft      to left leg    Family History  Problem Relation Age of Onset  . Coronary aneurysm Father   . Heart failure Sister     History  Substance Use Topics  . Smoking status: Current Some Day Smoker -- 63 years  . Smokeless tobacco: Never Used  . Alcohol Use: No    OB History   Grav Para Term Preterm Abortions TAB SAB Ect Mult Living                  Review of Systems  Unable to perform ROS: Dementia    Allergies  Aricept  Home Medications   Current Outpatient Rx  Name  Route  Sig  Dispense  Refill  . acetaminophen (TYLENOL) 325 MG tablet   Oral   Take 650 mg by mouth 2 (two) times daily as needed. For pain         . alendronate (FOSAMAX) 35 MG tablet   Oral   Take 35 mg by mouth every 7 (seven) days. Take with a full glass of water on an empty stomach.  Taken on Fridays.         Marland Kitchen aspirin EC 81 MG EC tablet   Oral   Take 1  tablet (81 mg total) by mouth daily.         . Calcium Carbonate-Vitamin D (CALCIUM 500+D PO)   Oral   Take 1 tablet by mouth daily.          . cephALEXin (KEFLEX) 250 MG capsule   Oral   Take 250 mg by mouth 4 (four) times daily.         Marland Kitchen levothyroxine (SYNTHROID, LEVOTHROID) 50 MCG tablet   Oral   Take 50 mcg by mouth daily.         . nitroGLYCERIN (NITROSTAT) 0.4 MG SL tablet   Sublingual   Place 1 tablet (0.4 mg total) under the tongue every 5 (five) minutes x 3 doses as needed for chest pain.   20 tablet   0   . traMADol (ULTRAM) 50 MG tablet   Oral   Take 25 mg by mouth 3 (three) times daily. 14 day course of therapy started 02/13/13.         Marland Kitchen  cephALEXin (KEFLEX) 500 MG capsule   Oral   Take 1 capsule (500 mg total) by mouth 3 (three) times daily.   30 capsule   0     BP 121/48  Pulse 68  Temp(Src) 98.2 F (36.8 C) (Oral)  Resp 16  SpO2 97%  Physical Exam  Nursing note and vitals reviewed. Constitutional: She appears well-developed and well-nourished. No distress.  Sleeping comfortably  HENT:  Head: Normocephalic and atraumatic.  Eyes: EOM are normal. No scleral icterus.  Neck: Neck supple. No spinous process tenderness and no muscular tenderness present. Normal range of motion present.  Cardiovascular: Normal rate and regular rhythm.   Pulmonary/Chest: Effort normal. No respiratory distress. She has no wheezes.  Abdominal: Soft. She exhibits no distension. There is no tenderness. There is no rebound and no guarding.  Musculoskeletal:       Right shoulder: Normal.       Left shoulder: Normal.       Right elbow: Normal.      Left elbow: Normal.       Right hip: She exhibits decreased range of motion and tenderness. She exhibits no bony tenderness, no swelling, no crepitus and no deformity.       Left hip: She exhibits decreased range of motion and tenderness. She exhibits no bony tenderness, no crepitus and no deformity.       Right knee: She  exhibits no swelling. No tenderness found.       Left knee: She exhibits no swelling. No tenderness found.       Cervical back: She exhibits no tenderness and no bony tenderness.       Thoracic back: She exhibits no tenderness and no bony tenderness.       Lumbar back: She exhibits tenderness. She exhibits no deformity and no laceration.       Hands: Neurological: She is alert.  Skin: Skin is warm. She is not diaphoretic.  Psychiatric: She has a normal mood and affect.    ED Course  Procedures (including critical care time)  Labs Reviewed - No data to display Dg Lumbar Spine Complete  02/21/2013  *RADIOLOGY REPORT*  Clinical Data: Fall.  Pain.  LUMBAR SPINE - COMPLETE 4+ VIEW  Comparison: 02/14/2013  Findings: Stable alignment.  No vertebral compression deformity. Stable degenerative change.  Osteopenia.  Vascular calcifications.  IMPRESSION: No acute bony pathology.  Stable.   Original Report Authenticated By: Jolaine Click, M.D.    Dg Hip Bilateral Vito Berger  02/21/2013  *RADIOLOGY REPORT*  Clinical Data: Pain after fall.  BILATERAL HIP WITH PELVIS - 4+ VIEW  Comparison: 02/17/2013  Findings: Stable appearance of acute fractures of the right superior and inferior pubic ramus since previous study.  No new fractures of the pelvis, right hip, or left hip are demonstrated. No significant displacement.  Degenerative changes in the hips.  SI joints and symphysis pubis appear intact.  No focal bone lesion.  IMPRESSION: Acute superior and inferior right pubic ramus fractures without change since previous studies.  No new fractures demonstrated.   Original Report Authenticated By: Burman Nieves, M.D.      1. Fall at nursing home, initial encounter   2. Back pain   3. Contusion, hip, unspecified laterality, initial encounter     ra sat is 97% and I interpret to be normal  12:08 AM Pt's plain films show no other acute injuries today, pt remains comfortable, will put on keflex for finger tip which  is mildly swollen and  red.    MDM  Prior records shows that laceration occurred only 5 days ago . No discharge, due to swelling and redness, will put on oral abx and have her follow up with PCP or hand surgery in 4 days.  Pt is DNR, has no complaints of CP, no ACS symptoms.  Pt has no evidence of sig head injury. Pt has pain in low back and both hips, not pelvis.  Will get plain films and give tylenol for mild pain.          Gavin Pound. Oletta Lamas, MD 02/22/13 4098

## 2013-02-21 NOTE — ED Notes (Signed)
Per EMS report: Pt from [facility]: Pt has had multiple falls for the past weeks.  Pt dx plevic fracture last week.  Pt had another unwitnessed fall this evening.  Pt hx of dementia and is currently very confused.  Pt c/o of pain to the right hip but that is the site of the fracture.  Pt alert but not oriented. EMS did not note any deformities.

## 2013-02-21 NOTE — ED Notes (Signed)
AVW:[id number]<XB> Expected date:<BR> Expected time:<BR> Means of arrival:<BR> Comments:<BR> EMS/79 yo female with frequent falls-previous pelvic fracture/fall tonight from [facility]

## 2013-02-22 ENCOUNTER — Telehealth (HOSPITAL_COMMUNITY): Payer: Self-pay | Admitting: Emergency Medicine

## 2013-02-22 MED ORDER — CLINDAMYCIN HCL 150 MG PO CAPS: 150.0000 mg | ORAL_CAPSULE | Freq: Four times a day (QID) | ORAL | Status: DC

## 2013-02-22 MED ORDER — CEPHALEXIN 500 MG PO CAPS: 500.0000 mg | ORAL_CAPSULE | Freq: Three times a day (TID) | ORAL | Status: DC

## 2013-02-22 NOTE — ED Notes (Signed)
Call from [facility] stated Med tech took verbal order to stop keflex and start clindamycin but facility needs this in writing.  Will have MD review chart.

## 2013-02-22 NOTE — ED Notes (Signed)
Pt able to wear weight on legs and take very small steps with right leg.  Ghim, MD made aware.

## 2013-02-22 NOTE — ED Notes (Signed)
[facility] called for transport.  

## 2013-02-22 NOTE — ED Notes (Addendum)
Went into pt's room to check on pt, pt was found on the floor.  Skin tear noted on pt's right elbow and an abrasion on pt's right knee.  Hematoma on pt's right inner forearm.  Pt is alert but is confused per her baseline. Thayer Ohm, [place] notified and then conducted an exam.  Dressings applied to skin tear and abrasion.  Pt moved to [facility] for better monitoring of pt.

## 2013-06-26 ENCOUNTER — Encounter (HOSPITAL_COMMUNITY): Payer: Self-pay | Admitting: Emergency Medicine

## 2013-06-26 ENCOUNTER — Emergency Department (HOSPITAL_COMMUNITY)
Admission: EM | Admit: 2013-06-26 | Discharge: 2013-06-26 | Disposition: A | Discharge status: Home / Self Care | Payer: Medicare Other | Attending: Emergency Medicine | Admitting: Emergency Medicine

## 2013-06-26 ENCOUNTER — Emergency Department (HOSPITAL_COMMUNITY): Payer: Medicare Other

## 2013-06-26 DIAGNOSIS — Z7982 Long term (current) use of aspirin: Secondary | ICD-10-CM | POA: Insufficient documentation

## 2013-06-26 DIAGNOSIS — M84454A Pathological fracture, pelvis, initial encounter for fracture: Secondary | ICD-10-CM

## 2013-06-26 DIAGNOSIS — F17200 Nicotine dependence, unspecified, uncomplicated: Secondary | ICD-10-CM | POA: Insufficient documentation

## 2013-06-26 DIAGNOSIS — E039 Hypothyroidism, unspecified: Secondary | ICD-10-CM | POA: Insufficient documentation

## 2013-06-26 DIAGNOSIS — Y9389 Activity, other specified: Secondary | ICD-10-CM | POA: Insufficient documentation

## 2013-06-26 DIAGNOSIS — Z87448 Personal history of other diseases of urinary system: Secondary | ICD-10-CM | POA: Insufficient documentation

## 2013-06-26 DIAGNOSIS — Y929 Unspecified place or not applicable: Secondary | ICD-10-CM | POA: Insufficient documentation

## 2013-06-26 DIAGNOSIS — Z79899 Other long term (current) drug therapy: Secondary | ICD-10-CM | POA: Insufficient documentation

## 2013-06-26 DIAGNOSIS — W19XXXA Unspecified fall, initial encounter: Secondary | ICD-10-CM | POA: Insufficient documentation

## 2013-06-26 DIAGNOSIS — Z8659 Personal history of other mental and behavioral disorders: Secondary | ICD-10-CM | POA: Insufficient documentation

## 2013-06-26 DIAGNOSIS — Z8739 Personal history of other diseases of the musculoskeletal system and connective tissue: Secondary | ICD-10-CM | POA: Insufficient documentation

## 2013-06-26 DIAGNOSIS — I10 Essential (primary) hypertension: Secondary | ICD-10-CM | POA: Insufficient documentation

## 2013-06-26 DIAGNOSIS — S329XXA Fracture of unspecified parts of lumbosacral spine and pelvis, initial encounter for closed fracture: Secondary | ICD-10-CM | POA: Insufficient documentation

## 2013-06-26 DIAGNOSIS — F0390 Unspecified dementia without behavioral disturbance: Secondary | ICD-10-CM | POA: Insufficient documentation

## 2013-06-26 NOTE — ED Notes (Signed)
Judeth Cornfield, RN given report on pt

## 2013-06-26 NOTE — ED Notes (Signed)
Per EMS, pt on fall precautions, possible fall from nursing staff.  Additional mattress on floor, patient was lying on floor next to this mattress, folding clothes on this additional mattress.  Pt c/o neck and upper back pain.  LSB, c-collar, and head blocks upon arrival.  C/o bilateral hip pain, no deformity noted, able to move all extremities.

## 2013-06-26 NOTE — ED Provider Notes (Signed)
CSN: [id number]     Arrival date & time 06/26/13  0218 History     First MD Initiated Contact with Patient 06/26/13 0222     Chief Complaint  Patient presents with  . Fall   (Consider location/radiation/quality/duration/timing/severity/associated sxs/prior Treatment) HPI 79 yo female presents to the ER from her nursing facility via EMS after being found on the floor next to her bed folding clothes.  Pt is a fall risk, there was a mattress placed on the floor next to the bed.  Unwitnessed fall.  Pt with h/o dementia, she is unable to give history.  Past Medical History  Diagnosis Date  . Hypertension   . Hypothyroidism   . Mental disorder   . DEMENTIA   . Anxiety   . Chest pain 2013  . Weakness   . Dysphagia   . Muscle/ligament disorder   . Speech disorder   . Renal disorder    Past Surgical History  Procedure Laterality Date  . Skin graft      to left leg   Family History  Problem Relation Age of Onset  . Coronary aneurysm Father   . Heart failure Sister    History  Substance Use Topics  . Smoking status: Current Some Day Smoker -- 63 years  . Smokeless tobacco: Never Used  . Alcohol Use: No   OB History   Grav Para Term Preterm Abortions TAB SAB Ect Mult Living                 Review of Systems  All other systems reviewed and are negative.    Allergies  Aricept  Home Medications   Current Outpatient Rx  Name  Route  Sig  Dispense  Refill  . aspirin EC 81 MG tablet   Oral   Take 81 mg by mouth every morning.         . calcium-vitamin D (OSCAL WITH D) 500-200 MG-UNIT per tablet   Oral   Take 1 tablet by mouth every morning.         Marland Kitchen ENSURE PLUS (ENSURE PLUS) LIQD   Oral   Take 237 mLs by mouth 2 (two) times daily between meals.         Marland Kitchen levothyroxine (SYNTHROID, LEVOTHROID) 50 MCG tablet   Oral   Take 50 mcg by mouth daily before breakfast.         . Multiple Vitamin (MULTIVITAMIN WITH MINERALS) TABS tablet   Oral   Take 1 tablet  by mouth every morning.         . senna (SENOKOT) 8.6 MG TABS tablet   Oral   Take 1 tablet by mouth at bedtime.         . nitroGLYCERIN (NITROSTAT) 0.4 MG SL tablet   Sublingual   Place 0.4 mg under the tongue every 5 (five) minutes x 3 doses as needed for chest pain.          BP 143/60  Pulse 61  Temp(Src) 97.5 F (36.4 C) (Oral)  Resp 18  SpO2 96% Physical Exam  Nursing note and vitals reviewed. Constitutional: She appears well-developed and well-nourished. No distress.  HENT:  Head: Normocephalic and atraumatic.  Nose: Nose normal.  Mouth/Throat: Oropharynx is clear and moist.  Eyes: Conjunctivae and EOM are normal. Pupils are equal, round, and reactive to light.  Neck: Normal range of motion. Neck supple. No JVD present. No tracheal deviation present. No thyromegaly present.  Cardiovascular: Normal rate, regular rhythm, normal  heart sounds and intact distal pulses.  Exam reveals no gallop and no friction rub.   No murmur heard. Pulmonary/Chest: Effort normal and breath sounds normal. No stridor. No respiratory distress. She has no wheezes. She has no rales. She exhibits no tenderness.  Abdominal: Soft. Bowel sounds are normal. She exhibits no distension and no mass. There is no tenderness. There is no rebound and no guarding.  Musculoskeletal: Normal range of motion. She exhibits no edema.  Difficult to ascertain pain-pt reports pain all over, but no pain elicited by movement of extremities or palpation  Lymphadenopathy:    She has no cervical adenopathy.  Skin: Skin is warm and dry. No rash noted. No erythema. No pallor.    ED Course   Procedures (including critical care time)  Labs Reviewed - No data to display Dg Lumbar Spine Complete  06/26/2013   *RADIOLOGY REPORT*  Clinical Data: Fall.  Low back pain.  LUMBAR SPINE - COMPLETE 4+ VIEW  Comparison: Plain films 02/21/2013.  Findings: No lumbar spine fracture is identified.  There is new sclerosis in the left  sacral ala most consistent with fracture. Scoliosis and multilevel degenerative disease again seen.  Left renal calcification is also again noted.  IMPRESSION:  1.  Findings highly suspicious for left sacral ala fracture. 2.  Negative for lumbar spine fracture with degenerative change again identified.   Original Report Authenticated By: Holley Dexter, M.D.   Dg Pelvis 1-2 Views  06/26/2013   *RADIOLOGY REPORT*  Clinical Data: Status post fall.  Low back pain.  PELVIS - 1-2 VIEW  Comparison: Single view of the pelvis 02/21/2013.  Findings: Previously seen right superior and inferior pubic rami fractures demonstrate progressive healing with extensive callus formation present.  There is new sclerosis and cortical irregularity of the sacral ala bilaterally compatible with insufficiency fractures, age indeterminate.  Both hips are located. No hip fracture is identified.  IMPRESSION:  1.  Bilateral sacral insufficiency fractures appear new since the prior exam. 2.  Previously seen right pubic rami fractures demonstrate progressive healing.   Original Report Authenticated By: Holley Dexter, M.D.   Ct Head Wo Contrast  06/26/2013   *RADIOLOGY REPORT*  Clinical Data:  Patient found down.  Neck pain.  CT HEAD WITHOUT CONTRAST CT CERVICAL SPINE WITHOUT CONTRAST  Technique:  Multidetector CT imaging of the head and cervical spine was performed following the standard protocol without intravenous contrast.  Multiplanar CT image reconstructions of the cervical spine were also generated.  Comparison:  Head and cervical spine CT scan 02/17/2013.  CT HEAD  Findings: Cortical atrophy and chronic microvascular ischemic change are again seen.  No evidence of acute abnormality including infarction, hemorrhage, mass lesion, mass effect, midline shift or abnormal extra-axial fluid collection is identified.  There is no hydrocephalus or pneumocephalus.  The calvarium is intact.  IMPRESSION: No acute finding.  Atrophy and chronic  microvascular ischemic change.  CT CERVICAL SPINE  Findings: No cervical fracture is identified.  Facet mediated anterolisthesis of C4 on C5 is unchanged.  Marked loss of disc space height at C5-6 and C6-7 is seen, unchanged.  Paraspinous structures unremarkable.  IMPRESSION: No acute finding.  Degenerative disease.  Stable compared prior exam.   Original Report Authenticated By: Holley Dexter, M.D.   Ct Cervical Spine Wo Contrast  06/26/2013   *RADIOLOGY REPORT*  Clinical Data:  Patient found down.  Neck pain.  CT HEAD WITHOUT CONTRAST CT CERVICAL SPINE WITHOUT CONTRAST  Technique:  Multidetector CT imaging of  the head and cervical spine was performed following the standard protocol without intravenous contrast.  Multiplanar CT image reconstructions of the cervical spine were also generated.  Comparison:  Head and cervical spine CT scan 02/17/2013.  CT HEAD  Findings: Cortical atrophy and chronic microvascular ischemic change are again seen.  No evidence of acute abnormality including infarction, hemorrhage, mass lesion, mass effect, midline shift or abnormal extra-axial fluid collection is identified.  There is no hydrocephalus or pneumocephalus.  The calvarium is intact.  IMPRESSION: No acute finding.  Atrophy and chronic microvascular ischemic change.  CT CERVICAL SPINE  Findings: No cervical fracture is identified.  Facet mediated anterolisthesis of C4 on C5 is unchanged.  Marked loss of disc space height at C5-6 and C6-7 is seen, unchanged.  Paraspinous structures unremarkable.  IMPRESSION: No acute finding.  Degenerative disease.  Stable compared prior exam.   Original Report Authenticated By: Holley Dexter, M.D.   1. Fall, initial encounter   2. Insufficiency fracture of pelvis, initial encounter     MDM  79 yo female with possible fall.  Will plan for CT head, cspine and lumbar films.  Pt with insufficiency fractures since April.  Non-tender on exam.  Will plan to d/c back to nursing  facility  Olivia Mackie, MD 06/26/13 [phone]

## 2013-06-26 NOTE — ED Notes (Signed)
Pt cleared from LSB and head blocks, pt c/o neck, upper and mid back pain and bilateral hip pain.

## 2013-06-26 NOTE — ED Notes (Signed)
[id number]> Expected date:<BR> Expected time:<BR> Means of arrival:<BR> Comments:<BR> EMS/79 yo female from [facility] on floor

## 2013-06-26 NOTE — ED Notes (Signed)
[facility] called for transport.  

## 2013-07-11 ENCOUNTER — Emergency Department (HOSPITAL_COMMUNITY): Payer: Medicare Other

## 2013-07-11 ENCOUNTER — Encounter (HOSPITAL_COMMUNITY): Payer: Self-pay

## 2013-07-11 ENCOUNTER — Inpatient Hospital Stay (HOSPITAL_COMMUNITY)
Admission: EM | Admit: 2013-07-11 | Discharge: 2013-07-16 | DRG: 470 | Disposition: A | Discharge status: Skilled Nursing Facility | Payer: Medicare Other | Attending: Family Medicine | Admitting: Family Medicine

## 2013-07-11 DIAGNOSIS — N289 Disorder of kidney and ureter, unspecified: Secondary | ICD-10-CM

## 2013-07-11 DIAGNOSIS — R6510 Systemic inflammatory response syndrome (SIRS) of non-infectious origin without acute organ dysfunction: Secondary | ICD-10-CM | POA: Diagnosis present

## 2013-07-11 DIAGNOSIS — F17200 Nicotine dependence, unspecified, uncomplicated: Secondary | ICD-10-CM | POA: Diagnosis present

## 2013-07-11 DIAGNOSIS — W19XXXA Unspecified fall, initial encounter: Secondary | ICD-10-CM | POA: Diagnosis present

## 2013-07-11 DIAGNOSIS — R079 Chest pain, unspecified: Secondary | ICD-10-CM

## 2013-07-11 DIAGNOSIS — S72002S Fracture of unspecified part of neck of left femur, sequela: Secondary | ICD-10-CM

## 2013-07-11 DIAGNOSIS — Z66 Do not resuscitate: Secondary | ICD-10-CM | POA: Diagnosis present

## 2013-07-11 DIAGNOSIS — Z79899 Other long term (current) drug therapy: Secondary | ICD-10-CM

## 2013-07-11 DIAGNOSIS — I10 Essential (primary) hypertension: Secondary | ICD-10-CM

## 2013-07-11 DIAGNOSIS — E039 Hypothyroidism, unspecified: Secondary | ICD-10-CM

## 2013-07-11 DIAGNOSIS — R000 Tachycardia, unspecified: Secondary | ICD-10-CM | POA: Diagnosis present

## 2013-07-11 DIAGNOSIS — D62 Acute posthemorrhagic anemia: Secondary | ICD-10-CM

## 2013-07-11 DIAGNOSIS — S72002A Fracture of unspecified part of neck of left femur, initial encounter for closed fracture: Secondary | ICD-10-CM

## 2013-07-11 DIAGNOSIS — F0390 Unspecified dementia without behavioral disturbance: Secondary | ICD-10-CM

## 2013-07-11 DIAGNOSIS — Y9210 Unspecified residential institution as the place of occurrence of the external cause: Secondary | ICD-10-CM | POA: Diagnosis present

## 2013-07-11 DIAGNOSIS — F411 Generalized anxiety disorder: Secondary | ICD-10-CM | POA: Diagnosis present

## 2013-07-11 DIAGNOSIS — S72009A Fracture of unspecified part of neck of unspecified femur, initial encounter for closed fracture: Principal | ICD-10-CM | POA: Diagnosis present

## 2013-07-11 DIAGNOSIS — N390 Urinary tract infection, site not specified: Secondary | ICD-10-CM | POA: Diagnosis present

## 2013-07-11 DIAGNOSIS — L0390 Cellulitis, unspecified: Secondary | ICD-10-CM

## 2013-07-11 DIAGNOSIS — I959 Hypotension, unspecified: Secondary | ICD-10-CM

## 2013-07-11 HISTORY — DX: Fracture of superior rim of right pubis, subsequent encounter for fracture with routine healing: S32.511D

## 2013-07-11 LAB — CBC WITH DIFFERENTIAL/PLATELET
Basophils Absolute: 0 10*3/uL (ref 0.0–0.1)
Eosinophils Relative: 1 % (ref 0–5)
HCT: 33 % — ABNORMAL LOW (ref 36.0–46.0)
Hemoglobin: 11.3 g/dL — ABNORMAL LOW (ref 12.0–15.0)
Lymphocytes Relative: 14 % (ref 12–46)
Lymphs Abs: 1.6 10*3/uL (ref 0.7–4.0)
MCV: 91.4 fL (ref 78.0–100.0)
Monocytes Absolute: 0.9 10*3/uL (ref 0.1–1.0)
Monocytes Relative: 8 % (ref 3–12)
Neutro Abs: 8.6 10*3/uL — ABNORMAL HIGH (ref 1.7–7.7)
RDW: 15.6 % — ABNORMAL HIGH (ref 11.5–15.5)
WBC: 11.2 10*3/uL — ABNORMAL HIGH (ref 4.0–10.5)

## 2013-07-11 LAB — PROTIME-INR
INR: 0.9 (ref 0.00–1.49)
Prothrombin Time: 12 seconds (ref 11.6–15.2)

## 2013-07-11 MED ORDER — ADULT MULTIVITAMIN W/MINERALS CH
1.0000 | ORAL_TABLET | Freq: Every morning | ORAL | Status: DC
  Administered 2013-07-14 – 2013-07-16 (×3): 1 via ORAL
  Filled 2013-07-11 (×5): qty 1

## 2013-07-11 MED ORDER — HYDROCODONE-ACETAMINOPHEN 5-325 MG PO TABS
1.0000 | ORAL_TABLET | ORAL | Status: AC | PRN
  Administered 2013-07-12: 1 via ORAL
  Filled 2013-07-11: qty 1

## 2013-07-11 MED ORDER — MORPHINE SULFATE 2 MG/ML IJ SOLN
0.5000 mg | INTRAMUSCULAR | Status: DC | PRN
  Administered 2013-07-12 – 2013-07-13 (×5): 0.5 mg via INTRAVENOUS
  Filled 2013-07-11 (×5): qty 1

## 2013-07-11 MED ORDER — LEVOTHYROXINE SODIUM 50 MCG PO TABS
50.0000 ug | ORAL_TABLET | Freq: Every day | ORAL | Status: DC
  Administered 2013-07-12 – 2013-07-16 (×4): 50 ug via ORAL
  Filled 2013-07-11 (×6): qty 1

## 2013-07-11 MED ORDER — ONDANSETRON HCL 4 MG/2ML IJ SOLN: 4.0000 mg | Freq: Three times a day (TID) | INTRAMUSCULAR | Status: AC | PRN

## 2013-07-11 MED ORDER — SENNA 8.6 MG PO TABS
1.0000 | ORAL_TABLET | Freq: Every day | ORAL | Status: DC
  Administered 2013-07-13 – 2013-07-16 (×3): 8.6 mg via ORAL
  Filled 2013-07-11 (×3): qty 1

## 2013-07-11 MED ORDER — ASPIRIN EC 81 MG PO TBEC
81.0000 mg | DELAYED_RELEASE_TABLET | Freq: Every morning | ORAL | Status: DC
  Filled 2013-07-11 (×2): qty 1

## 2013-07-11 NOTE — ED Notes (Signed)
Pt presents via EMS from [place] nursing facility. Pt tripped and fell and per EMS has golf ball sized hematoma to her left temple and a hematoma to her left elbow. Per EMS pt is not on blood thinners. Per EMS, pt also has a skin tear on left wrist. Pt told EMS she had pain in her right shoulder and mid back. Per EMS, pt was laying on the floor when they arrived. Pt does have a hx of dementia.

## 2013-07-11 NOTE — ED Notes (Signed)
Bed: AV40 Expected date:  Expected time:  Means of arrival:  Comments: EMS, fall in [place]

## 2013-07-11 NOTE — H&P (Addendum)
[facility] History and Physical  [name] WGN:[id number] DOB: 07-31-1927 DOA: 07/11/2013  Referring physician: ED PCP: Florentina Jenny, MD   Chief Complaint: Fall  HPI: [name] is a 79 y.o. female who presents to the ED from her SNF after a witnessed fall.  No LOC per patient, patient not on blood thinner, initally complaining of back pain but patient has fairly significant dementia which is complicating her ability to give history.  In the ED, CT head and neck was negative, but X ray of her hip demonstrated new L femoral neck fracture.  Hospitalist asked to admit and ortho consulted and will see her in AM.  Review of Systems: 12 systems reviewed and otherwise negative.  Past Medical History  Diagnosis Date  . Hypertension   . Hypothyroidism   . Mental disorder   . DEMENTIA   . Anxiety   . Chest pain 2013  . Weakness   . Dysphagia   . Muscle/ligament disorder   . Speech disorder   . Renal disorder    Past Surgical History  Procedure Laterality Date  . Skin graft      to left leg   Social History:  reports that she has been smoking.  She has never used smokeless tobacco. She reports that she does not drink alcohol or use illicit drugs.   Allergies  Allergen Reactions  . Aricept [Donepezil Hcl] Nausea And Vomiting    Family History  Problem Relation Age of Onset  . Coronary aneurysm Father   . Heart failure Sister    Prior to Admission medications   Medication Sig Start Date End Date Taking? Authorizing Provider  aspirin EC 81 MG tablet Take 81 mg by mouth every morning.   Yes Historical Provider, MD  calcium-vitamin D (OSCAL WITH D) 500-200 MG-UNIT per tablet Take 1 tablet by mouth every morning.   Yes Historical Provider, MD  ENSURE PLUS (ENSURE PLUS) LIQD Take 237 mLs by mouth 2 (two) times daily between meals.   Yes Historical Provider, MD  levothyroxine (SYNTHROID, LEVOTHROID) 50 MCG tablet Take 50 mcg by mouth daily before breakfast.   Yes  Historical Provider, MD  Multiple Vitamin (MULTIVITAMIN WITH MINERALS) TABS tablet Take 1 tablet by mouth every morning.   Yes Historical Provider, MD  senna (SENOKOT) 8.6 MG TABS tablet Take 1 tablet by mouth at bedtime.   Yes Historical Provider, MD  nitroGLYCERIN (NITROSTAT) 0.4 MG SL tablet Place 0.4 mg under the tongue every 5 (five) minutes x 3 doses as needed for chest pain. 04/27/12 04/27/14  Rodolph Bong, MD   Physical Exam: Filed Vitals:   07/11/13 2115  BP: 140/63  Pulse: 64  Temp: 98.1 F (36.7 C)  Resp: 18    General:  NAD, resting comfortably in bed Eyes: PEERLA EOMI ENT: mucous membranes moist Neck: supple w/o JVD Cardiovascular: RRR w/o MRG Respiratory: CTA B Abdomen: soft, nt, nd, bs+ Skin: no rash nor lesion Musculoskeletal: Painful ROM LLE Psychiatric: normal tone and affect Neurologic: AAOx3, grossly non-focal  Labs on Admission:  Basic Metabolic Panel: No results found for this basename: NA, K, CL, CO2, GLUCOSE, BUN, CREATININE, CALCIUM, MG, PHOS,  in the last 168 hours Liver Function Tests: No results found for this basename: AST, ALT, ALKPHOS, BILITOT, PROT, ALBUMIN,  in the last 168 hours No results found for this basename: LIPASE, AMYLASE,  in the last 168 hours No results found for this basename: AMMONIA,  in the last 168 hours CBC:  Recent Labs Lab 07/11/13 2335  WBC 11.2*  NEUTROABS 8.6*  HGB 11.3*  HCT 33.0*  MCV 91.4  PLT 225   Cardiac Enzymes: No results found for this basename: CKTOTAL, CKMB, CKMBINDEX, TROPONINI,  in the last 168 hours  BNP (last 3 results) No results found for this basename: PROBNP,  in the last 8760 hours CBG: No results found for this basename: GLUCAP,  in the last 168 hours  Radiological Exams on Admission: Dg Chest 2 View  07/11/2013   *RADIOLOGY REPORT*  Clinical Data: Preoperative respiratory exam.  Left hip fracture.  CHEST - 2 VIEW  Comparison: 02/17/2013  Findings: The heart size and pulmonary  vascularity are normal.  The patient has chronic interstitial and obstructive lung disease.  No acute osseous abnormality.  IMPRESSION: No acute disease.  Chronic lung disease.   Original Report Authenticated By: Francene Boyers, M.D.   Dg Hip Complete Left  07/11/2013   *RADIOLOGY REPORT*  Clinical Data: Left hip pain after fall today.  LEFT HIP - COMPLETE 2+ VIEW  Comparison: 02/17/2013  Findings: Subcapital femoral neck fracture with superior displacement of the distal fracture fragment resulting in varus angulation.  There is no dislocation of the hip joint.  No focal bone lesion is demonstrated.  Old appearing fracture deformities in the right superior and inferior pubic rami.  No acute or displaced pelvic fractures are demonstrated.  The SI joints and symphysis pubis are not displaced.  IMPRESSION: Left femoral neck fracture with varus angulation of the fracture fragments.  Old fracture deformities of the right superior and inferior pubic rami.   Original Report Authenticated By: Burman Nieves, M.D.   Ct Head Wo Contrast  07/11/2013   *RADIOLOGY REPORT*  Clinical Data:  Altered mental status, tripped and fell  CT HEAD WITHOUT CONTRAST CT MAXILLOFACIAL WITHOUT CONTRAST CT CERVICAL SPINE WITHOUT CONTRAST  Technique:  Multidetector CT imaging of the head, cervical spine, and maxillofacial structures were performed using the standard protocol without intravenous contrast. Multiplanar CT image reconstructions of the cervical spine and maxillofacial structures were also generated.  Comparison:   Prior study from 06/26/2013  CT HEAD  Findings: Diffuse prominence of the CSF containing spaces is similar as compared to the prior study, consistent with atrophy. Scattered and confluent hypodensity within the periventricular white matter is consistent with chronic small vessel ischemic changes.  No acute intracranial hemorrhage or infarct.  No midline shift or mass lesion.  Contusion overlies the left frontal scalp.   Orbits are intact. Mastoid air cells are clear.  IMPRESSION: 1.  No CT evidence of acute intracranial process. 2.  Atrophy with chronic microvascular ischemic changes.  CT MAXILLOFACIAL  Findings:  Study is degraded by motion artifact.Left forehead contusion is noted.  The globes are intact. Paranasal sinuses are clear.  IMPRESSION:  Limited study due to motion.  Left forehead contusion without acute maxillofacial fracture.  Intact globes.  CT CERVICAL SPINE  Findings:   There is no acute fracture listhesis within the cervical spine. One anterolisthesis of C4 and C5.  Severe degenerative intervertebral disc space narrowing is present at C5-6 and C6-7.  Vertebral body heights are preserved. The normal C1-2 articulations are intact.  IMPRESSION: No acute fracture or listhesis.   Original Report Authenticated By: Rise Mu, M.D.   Ct Cervical Spine Wo Contrast  07/11/2013   *RADIOLOGY REPORT*  Clinical Data:  Altered mental status, tripped and fell  CT HEAD WITHOUT CONTRAST CT MAXILLOFACIAL WITHOUT CONTRAST CT CERVICAL SPINE WITHOUT CONTRAST  Technique:  Multidetector CT imaging of the head, cervical spine, and maxillofacial structures were performed using the standard protocol without intravenous contrast. Multiplanar CT image reconstructions of the cervical spine and maxillofacial structures were also generated.  Comparison:   Prior study from 06/26/2013  CT HEAD  Findings: Diffuse prominence of the CSF containing spaces is similar as compared to the prior study, consistent with atrophy. Scattered and confluent hypodensity within the periventricular white matter is consistent with chronic small vessel ischemic changes.  No acute intracranial hemorrhage or infarct.  No midline shift or mass lesion.  Contusion overlies the left frontal scalp.  Orbits are intact. Mastoid air cells are clear.  IMPRESSION: 1.  No CT evidence of acute intracranial process. 2.  Atrophy with chronic microvascular ischemic  changes.  CT MAXILLOFACIAL  Findings:  Study is degraded by motion artifact.Left forehead contusion is noted.  The globes are intact. Paranasal sinuses are clear.  IMPRESSION:  Limited study due to motion.  Left forehead contusion without acute maxillofacial fracture.  Intact globes.  CT CERVICAL SPINE  Findings:   There is no acute fracture listhesis within the cervical spine. One anterolisthesis of C4 and C5.  Severe degenerative intervertebral disc space narrowing is present at C5-6 and C6-7.  Vertebral body heights are preserved. The normal C1-2 articulations are intact.  IMPRESSION: No acute fracture or listhesis.   Original Report Authenticated By: Rise Mu, M.D.   Ct Maxillofacial Wo Cm  07/11/2013   *RADIOLOGY REPORT*  Clinical Data:  Altered mental status, tripped and fell  CT HEAD WITHOUT CONTRAST CT MAXILLOFACIAL WITHOUT CONTRAST CT CERVICAL SPINE WITHOUT CONTRAST  Technique:  Multidetector CT imaging of the head, cervical spine, and maxillofacial structures were performed using the standard protocol without intravenous contrast. Multiplanar CT image reconstructions of the cervical spine and maxillofacial structures were also generated.  Comparison:   Prior study from 06/26/2013  CT HEAD  Findings: Diffuse prominence of the CSF containing spaces is similar as compared to the prior study, consistent with atrophy. Scattered and confluent hypodensity within the periventricular white matter is consistent with chronic small vessel ischemic changes.  No acute intracranial hemorrhage or infarct.  No midline shift or mass lesion.  Contusion overlies the left frontal scalp.  Orbits are intact. Mastoid air cells are clear.  IMPRESSION: 1.  No CT evidence of acute intracranial process. 2.  Atrophy with chronic microvascular ischemic changes.  CT MAXILLOFACIAL  Findings:  Study is degraded by motion artifact.Left forehead contusion is noted.  The globes are intact. Paranasal sinuses are clear.   IMPRESSION:  Limited study due to motion.  Left forehead contusion without acute maxillofacial fracture.  Intact globes.  CT CERVICAL SPINE  Findings:   There is no acute fracture listhesis within the cervical spine. One anterolisthesis of C4 and C5.  Severe degenerative intervertebral disc space narrowing is present at C5-6 and C6-7.  Vertebral body heights are preserved. The normal C1-2 articulations are intact.  IMPRESSION: No acute fracture or listhesis.   Original Report Authenticated By: Rise Mu, M.D.    EKG: Independently reviewed.  Assessment/Plan Principal Problem:   Closed left hip fracture Active Problems:   HTN (hypertension)   Dementia   1. Closed left hip fracture - from mechanical fall, ortho surgery to eval tomorrow for possible surgery, lab work up and EKG ordered and now pending for clearance.  Admitting to medicine service, on hip fracture path way, pain control, NPO after midnight, DVT ppx with SCDs. 2. HTN -  continue home meds 3. Dementia - chronic and appears to be baseline.    Code Status: DNR, has been DNR in past and has signed DNR form with patient (must indicate code status--if unknown or must be presumed, indicate so) Family Communication: No family in room, attempting to contact to let them know that patient is here with broke hip and may need surgery tomorrow(indicate person spoken with, if applicable, with phone number if by telephone) Disposition Plan: Admit to inpatient (indicate anticipated LOS)  Time spent:  GARDNER, JARED M. [facility] Pager [phone]  If 7PM-7AM, please contact night-coverage [email or web address] Password [facility] 07/11/2013, 11:57 PM

## 2013-07-11 NOTE — ED Notes (Signed)
MD at bedside. 

## 2013-07-11 NOTE — ED Notes (Signed)
Unable to do EKG pt in Xray

## 2013-07-11 NOTE — ED Provider Notes (Signed)
CSN: [id number]     Arrival date & time 07/11/13  2111 History     First MD Initiated Contact with Patient 07/11/13 2215     Chief Complaint  Patient presents with  . Fall   (Consider location/radiation/quality/duration/timing/severity/associated sxs/prior Treatment) HPI Patient presents from nursing home after a witnessed fall.  Per report there was no loss of consciousness, and the patient is not on blood thinner.  The patient initially complained of back pain.  Patient's dementia, complicating history of present illness.  She does complain of pain in her head, neck, hips.  Pain seems severe.  No medication attempts for control of far. Past Medical History  Diagnosis Date  . Hypertension   . Hypothyroidism   . Mental disorder   . DEMENTIA   . Anxiety   . Chest pain 2013  . Weakness   . Dysphagia   . Muscle/ligament disorder   . Speech disorder   . Renal disorder    Past Surgical History  Procedure Laterality Date  . Skin graft      to left leg   Family History  Problem Relation Age of Onset  . Coronary aneurysm Father   . Heart failure Sister    History  Substance Use Topics  . Smoking status: Current Some Day Smoker -- 63 years  . Smokeless tobacco: Never Used  . Alcohol Use: No   OB History   Grav Para Term Preterm Abortions TAB SAB Ect Mult Living                 Review of Systems  Unable to perform ROS: Dementia    Allergies  Aricept  Home Medications   Current Outpatient Rx  Name  Route  Sig  Dispense  Refill  . aspirin EC 81 MG tablet   Oral   Take 81 mg by mouth every morning.         . calcium-vitamin D (OSCAL WITH D) 500-200 MG-UNIT per tablet   Oral   Take 1 tablet by mouth every morning.         Marland Kitchen ENSURE PLUS (ENSURE PLUS) LIQD   Oral   Take 237 mLs by mouth 2 (two) times daily between meals.         Marland Kitchen levothyroxine (SYNTHROID, LEVOTHROID) 50 MCG tablet   Oral   Take 50 mcg by mouth daily before breakfast.         .  Multiple Vitamin (MULTIVITAMIN WITH MINERALS) TABS tablet   Oral   Take 1 tablet by mouth every morning.         . senna (SENOKOT) 8.6 MG TABS tablet   Oral   Take 1 tablet by mouth at bedtime.         . nitroGLYCERIN (NITROSTAT) 0.4 MG SL tablet   Sublingual   Place 0.4 mg under the tongue every 5 (five) minutes x 3 doses as needed for chest pain.          BP 140/63  Pulse 64  Temp(Src) 98.1 F (36.7 C) (Oral)  Resp 18  SpO2 98% Physical Exam  Nursing note and vitals reviewed. Constitutional: She appears well-developed and well-nourished.  Patient in c-collar with back mobilization  HENT:  Head: Normocephalic.  Nose: Nose normal.  Mouth/Throat: Oropharynx is clear and moist.  Hematoma, large, left for head.  Eyes: Conjunctivae and EOM are normal. Pupils are equal, round, and reactive to light.  Neck: Normal range of motion. Neck supple. No JVD present.  No tracheal deviation present. No thyromegaly present.  Cardiovascular: Normal rate, regular rhythm and intact distal pulses.   Pulmonary/Chest: Effort normal and breath sounds normal. No stridor. No respiratory distress. She has no wheezes. She has no rales.  Abdominal: Soft. Bowel sounds are normal. She exhibits no distension. There is no tenderness. There is no guarding.  Musculoskeletal: Normal range of motion. She exhibits no edema.  Difficult to ascertain pain-pt reports pain all over Pelvis is stable, but the patient c/o pain throughout the L LE Patient moves shoulders, elbows, wrists freely without limitation.  Skin: Skin is warm and dry. No rash noted. No erythema. No pallor.  Superficial skin tear left distal forearm, laterally, 3 cm    ED Course   Procedures (including critical care time)  Labs Reviewed - No data to display No results found. No diagnosis found. I reviewed the patient's chart, including recent presentation for similar fall.  I reviewed the patient's x-ray, notable for left femoral neck  fracture.  Subsequently I discussed her care with our orthopedist, Dr.Lucey MDM  This elderly female with Alzheimer's dementia presents from her nursing facility after a witnessed fall.  The patient does have facial trauma, but her CT scans of her face, head, neck were largely reassuring/consistent with prior evaluations.  Patient has evidence of a new left femoral neck fracture, requiring admission for further evaluation and management.  On admission preoperative labs were pending, but the patient was in no distress, hemodynamically stable.  Gerhard Munch, MD 07/11/13 2329

## 2013-07-12 ENCOUNTER — Encounter (HOSPITAL_COMMUNITY): Payer: Self-pay

## 2013-07-12 ENCOUNTER — Inpatient Hospital Stay (HOSPITAL_COMMUNITY): Payer: Medicare Other

## 2013-07-12 DIAGNOSIS — E039 Hypothyroidism, unspecified: Secondary | ICD-10-CM

## 2013-07-12 DIAGNOSIS — S72002A Fracture of unspecified part of neck of left femur, initial encounter for closed fracture: Secondary | ICD-10-CM

## 2013-07-12 DIAGNOSIS — S72009S Fracture of unspecified part of neck of unspecified femur, sequela: Secondary | ICD-10-CM

## 2013-07-12 LAB — COMPREHENSIVE METABOLIC PANEL
AST: 23 U/L (ref 0–37)
BUN: 33 mg/dL — ABNORMAL HIGH (ref 6–23)
CO2: 25 mEq/L (ref 19–32)
Calcium: 9.2 mg/dL (ref 8.4–10.5)
Chloride: 100 mEq/L (ref 96–112)
Creatinine, Ser: 1.37 mg/dL — ABNORMAL HIGH (ref 0.50–1.10)
GFR calc Af Amer: 40 mL/min — ABNORMAL LOW (ref >90)
GFR calc non Af Amer: 34 mL/min — ABNORMAL LOW (ref >90)
Glucose, Bld: 115 mg/dL — ABNORMAL HIGH (ref 70–99)
Total Bilirubin: 0.3 mg/dL (ref 0.3–1.2)

## 2013-07-12 LAB — URINALYSIS, ROUTINE W REFLEX MICROSCOPIC
Bilirubin Urine: NEGATIVE
Glucose, UA: NEGATIVE mg/dL
Ketones, ur: NEGATIVE mg/dL
Nitrite: NEGATIVE
Protein, ur: NEGATIVE mg/dL
Specific Gravity, Urine: 1.019 (ref 1.005–1.030)
Urobilinogen, UA: 0.2 mg/dL (ref 0.0–1.0)
pH: 6.5 (ref 5.0–8.0)

## 2013-07-12 LAB — SURGICAL PCR SCREEN
MRSA, PCR: NEGATIVE
Staphylococcus aureus: NEGATIVE

## 2013-07-12 LAB — URINE MICROSCOPIC-ADD ON

## 2013-07-12 MED ORDER — DEXTROSE-NACL 5-0.45 % IV SOLN
INTRAVENOUS | Status: DC
  Administered 2013-07-12: 75 mL/h via INTRAVENOUS
  Administered 2013-07-12: 03:00:00 via INTRAVENOUS
  Administered 2013-07-12: 75 mL/h via INTRAVENOUS

## 2013-07-12 MED ORDER — ENSURE COMPLETE PO LIQD
237.0000 mL | Freq: Two times a day (BID) | ORAL | Status: DC
  Administered 2013-07-13 – 2013-07-16 (×5): 237 mL via ORAL

## 2013-07-12 MED ORDER — CEFAZOLIN SODIUM-DEXTROSE 2-3 GM-% IV SOLR
2.0000 g | INTRAVENOUS | Status: DC
  Filled 2013-07-12: qty 50

## 2013-07-12 NOTE — Progress Notes (Signed)
Patient not able to activate MyChart due to dementia, being from a nursing home and no family present

## 2013-07-12 NOTE — Progress Notes (Signed)
INITIAL NUTRITION ASSESSMENT  DOCUMENTATION CODES Per approved criteria  -Not Applicable   INTERVENTION: Diet advancement per MD discretion: Recommend Dysphagia 3 Chopped meats Provide Ensure Complete BID when diet advanced   NUTRITION DIAGNOSIS: Predicted suboptimal energy intake related to scheduled surgery and dementia as evidenced by pt's chart.   Goal: Pt to meet >/= 90% of their estimated nutrition needs   Monitor:  Diet advancement PO Intake Weight Labs  Reason for Assessment: Consult  79 y.o. female  Admitting Dx: Closed left hip fracture  ASSESSMENT: 79 y.o. female who presents to the ED from her SNF after a witnessed fall. No LOC, patient not on blood thinner, initally complaining of back pain but patient has fairly significant dementia which is complicating her ability to give history.   Pt is unable to provide history per RN and no family present in room. Per paper chart pt was drinking Ensure Immune Health BID PTA and was on a mechanical soft diet with chopped meats.   Height: Ht Readings from Last 1 Encounters:  08/09/12 5\' 7"  (1.702 m)    Weight: Wt Readings from Last 1 Encounters:  07/12/13 124 lb 12.5 oz (56.6 kg)    Ideal Body Weight: 135 lbs  % Ideal Body Weight: 92%  Wt Readings from Last 10 Encounters:  07/12/13 124 lb 12.5 oz (56.6 kg)  08/09/12 110 lb (49.896 kg)  04/24/12 113 lb 5.1 oz (51.4 kg)    Usual Body Weight: unknown  % Usual Body Weight: NA  BMI:  Body mass index is 19.54 kg/(m^2).  Estimated Nutritional Needs: Kcal: 1400-1580 Protein: 70-80 grams Fluid: >/=1.7 L/day  Skin: non-pitting RLE and LLE edema. Abrasion on right leg, skin tear on left wrist  Diet Order: NPO  EDUCATION NEEDS: -No education needs identified at this time   Intake/Output Summary (Last 24 hours) at 07/12/13 1042 Last data filed at 07/12/13 0900  Gross per 24 hour  Intake    300 ml  Output    500 ml  Net   -200 ml    Last BM: PTA    Labs:   Recent Labs Lab 07/11/13 2335  NA 136  K 3.9  CL 100  CO2 25  BUN 33*  CREATININE 1.37*  CALCIUM 9.2  GLUCOSE 115*    CBG (last 3)  No results found for this basename: GLUCAP,  in the last 72 hours  Scheduled Meds: . aspirin EC  81 mg Oral q morning - 10a  . levothyroxine  50 mcg Oral QAC breakfast  . multivitamin with minerals  1 tablet Oral q morning - 10a  . senna  1 tablet Oral QHS    Continuous Infusions: . dextrose 5 % and 0.45% NaCl 75 mL/hr at 07/12/13 0300    Past Medical History  Diagnosis Date  . Hypertension   . Hypothyroidism   . Mental disorder   . DEMENTIA   . Anxiety   . Chest pain 2013  . Weakness   . Dysphagia   . Muscle/ligament disorder   . Speech disorder   . Renal disorder   . Fracture of right superior rim of pubis with routine healing     right superior and pubi rami fracture    Past Surgical History  Procedure Laterality Date  . Skin graft      to left leg    Ian Malkin RD, LDN Inpatient Clinical Dietitian Pager: [phone] After Hours Pager: [phone]

## 2013-07-12 NOTE — Care Management Note (Signed)
    Page 1 of 1   07/12/2013     11:47:48 AM   CARE MANAGEMENT NOTE 07/12/2013  Patient:  [name], [name]   Account Number:  [id number]  Date Initiated:  07/12/2013  Documentation initiated by:  Colleen Can  Subjective/Objective Assessment:   dx left femoral neck fracture; plans for surgery     Action/Plan:   Pt is from Nursing home   Anticipated DC Date:  07/14/2013   Anticipated DC Plan:  SKILLED NURSING FACILITY  In-house referral  Clinical Social Worker      DC Planning Services  CM consult      Choice offered to / List presented to:             Status of service:  Completed, signed off Medicare Important Message given?   (If response is "NO", the following Medicare IM given date fields will be blank) Date Medicare IM given:   Date Additional Medicare IM given:    Discharge Disposition:    Per UR Regulation:  Reviewed for med. necessity/level of care/duration of stay  If discussed at Long Length of Stay Meetings, dates discussed:    Comments:

## 2013-07-12 NOTE — Progress Notes (Signed)
[facility] PROGRESS NOTE  [name] KVQ:[id number] DOB: 04-08-1927 DOA: 07/11/2013 PCP: Florentina Jenny, MD  Assessment/Plan:  1. Closed left hip fracture - from mechanical fall, ortho surgery on board, lab work up and EKG ordered and shows normal sinus rhythm with no ST elevations or depressions. Continue on hip fracture path way, pain control, NPO, DVT ppx per surgeons. - Cleared from a medical standpoint  2 HTN - continue home meds   3 Dementia - chronic and appears to be baseline.  4. Hypothyroidism: stable on home synthroid.  Code Status: full Family Communication: No family at bedside Disposition Plan: Pending plans by surgical team.   Consultants:  Ortho  Procedures:  pending  Antibiotics:  None  HPI/Subjective: No new complaints. No acute issues overnight.  Objective: Filed Vitals:   07/12/13 1433  BP: 136/68  Pulse: 72  Temp: 98.3 F (36.8 C)  Resp: 17    Intake/Output Summary (Last 24 hours) at 07/12/13 1522 Last data filed at 07/12/13 1330  Gross per 24 hour  Intake    300 ml  Output   1200 ml  Net   -900 ml   Filed Weights   07/12/13 0116  Weight: 56.6 kg (124 lb 12.5 oz)    Exam:   General:  Pt in NAD, alert and Awake  Cardiovascular: RRR, no MRG  Respiratory: CTA BL, no wheezes  Abdomen: soft, NT, ND  Musculoskeletal: Pain at left hip  Data Reviewed: Basic Metabolic Panel:  Recent Labs Lab 07/11/13 2335  NA 136  K 3.9  CL 100  CO2 25  GLUCOSE 115*  BUN 33*  CREATININE 1.37*  CALCIUM 9.2   Liver Function Tests:  Recent Labs Lab 07/11/13 2335  AST 23  ALT 18  ALKPHOS 88  BILITOT 0.3  PROT 6.7  ALBUMIN 3.4*   No results found for this basename: LIPASE, AMYLASE,  in the last 168 hours No results found for this basename: AMMONIA,  in the last 168 hours CBC:  Recent Labs Lab 07/11/13 2335  WBC 11.2*  NEUTROABS 8.6*  HGB 11.3*  HCT 33.0*  MCV 91.4  PLT 225   Cardiac Enzymes: No results found  for this basename: CKTOTAL, CKMB, CKMBINDEX, TROPONINI,  in the last 168 hours BNP (last 3 results) No results found for this basename: PROBNP,  in the last 8760 hours CBG: No results found for this basename: GLUCAP,  in the last 168 hours  Recent Results (from the past 240 hour(s))  SURGICAL PCR SCREEN     Status: None   Collection Time    07/12/13  9:07 AM      Result Value Range Status   MRSA, PCR NEGATIVE  NEGATIVE Final   Staphylococcus aureus NEGATIVE  NEGATIVE Final   Comment:            The Xpert SA Assay (FDA     approved for NASAL specimens     in patients over 4 years of age),     is one component of     a comprehensive surveillance     program.  Test performance has     been validated by [employer] for patients greater     than or equal to 69 year old.     It is not intended     to diagnose infection nor to     guide or monitor treatment.     Studies: Dg Chest 2 View  07/11/2013   *  RADIOLOGY REPORT*  Clinical Data: Preoperative respiratory exam.  Left hip fracture.  CHEST - 2 VIEW  Comparison: 02/17/2013  Findings: The heart size and pulmonary vascularity are normal.  The patient has chronic interstitial and obstructive lung disease.  No acute osseous abnormality.  IMPRESSION: No acute disease.  Chronic lung disease.   Original Report Authenticated By: Francene Boyers, M.D.   Dg Hip Complete Left  07/11/2013   *RADIOLOGY REPORT*  Clinical Data: Left hip pain after fall today.  LEFT HIP - COMPLETE 2+ VIEW  Comparison: 02/17/2013  Findings: Subcapital femoral neck fracture with superior displacement of the distal fracture fragment resulting in varus angulation.  There is no dislocation of the hip joint.  No focal bone lesion is demonstrated.  Old appearing fracture deformities in the right superior and inferior pubic rami.  No acute or displaced pelvic fractures are demonstrated.  The SI joints and symphysis pubis are not displaced.  IMPRESSION: Left femoral neck  fracture with varus angulation of the fracture fragments.  Old fracture deformities of the right superior and inferior pubic rami.   Original Report Authenticated By: Burman Nieves, M.D.   Ct Head Wo Contrast  07/11/2013   *RADIOLOGY REPORT*  Clinical Data:  Altered mental status, tripped and fell  CT HEAD WITHOUT CONTRAST CT MAXILLOFACIAL WITHOUT CONTRAST CT CERVICAL SPINE WITHOUT CONTRAST  Technique:  Multidetector CT imaging of the head, cervical spine, and maxillofacial structures were performed using the standard protocol without intravenous contrast. Multiplanar CT image reconstructions of the cervical spine and maxillofacial structures were also generated.  Comparison:   Prior study from 06/26/2013  CT HEAD  Findings: Diffuse prominence of the CSF containing spaces is similar as compared to the prior study, consistent with atrophy. Scattered and confluent hypodensity within the periventricular white matter is consistent with chronic small vessel ischemic changes.  No acute intracranial hemorrhage or infarct.  No midline shift or mass lesion.  Contusion overlies the left frontal scalp.  Orbits are intact. Mastoid air cells are clear.  IMPRESSION: 1.  No CT evidence of acute intracranial process. 2.  Atrophy with chronic microvascular ischemic changes.  CT MAXILLOFACIAL  Findings:  Study is degraded by motion artifact.Left forehead contusion is noted.  The globes are intact. Paranasal sinuses are clear.  IMPRESSION:  Limited study due to motion.  Left forehead contusion without acute maxillofacial fracture.  Intact globes.  CT CERVICAL SPINE  Findings:   There is no acute fracture listhesis within the cervical spine. One anterolisthesis of C4 and C5.  Severe degenerative intervertebral disc space narrowing is present at C5-6 and C6-7.  Vertebral body heights are preserved. The normal C1-2 articulations are intact.  IMPRESSION: No acute fracture or listhesis.   Original Report Authenticated By: Rise Mu, M.D.   Ct Cervical Spine Wo Contrast  07/11/2013   *RADIOLOGY REPORT*  Clinical Data:  Altered mental status, tripped and fell  CT HEAD WITHOUT CONTRAST CT MAXILLOFACIAL WITHOUT CONTRAST CT CERVICAL SPINE WITHOUT CONTRAST  Technique:  Multidetector CT imaging of the head, cervical spine, and maxillofacial structures were performed using the standard protocol without intravenous contrast. Multiplanar CT image reconstructions of the cervical spine and maxillofacial structures were also generated.  Comparison:   Prior study from 06/26/2013  CT HEAD  Findings: Diffuse prominence of the CSF containing spaces is similar as compared to the prior study, consistent with atrophy. Scattered and confluent hypodensity within the periventricular white matter is consistent with chronic small vessel ischemic changes.  No acute  intracranial hemorrhage or infarct.  No midline shift or mass lesion.  Contusion overlies the left frontal scalp.  Orbits are intact. Mastoid air cells are clear.  IMPRESSION: 1.  No CT evidence of acute intracranial process. 2.  Atrophy with chronic microvascular ischemic changes.  CT MAXILLOFACIAL  Findings:  Study is degraded by motion artifact.Left forehead contusion is noted.  The globes are intact. Paranasal sinuses are clear.  IMPRESSION:  Limited study due to motion.  Left forehead contusion without acute maxillofacial fracture.  Intact globes.  CT CERVICAL SPINE  Findings:   There is no acute fracture listhesis within the cervical spine. One anterolisthesis of C4 and C5.  Severe degenerative intervertebral disc space narrowing is present at C5-6 and C6-7.  Vertebral body heights are preserved. The normal C1-2 articulations are intact.  IMPRESSION: No acute fracture or listhesis.   Original Report Authenticated By: Rise Mu, M.D.   Ct Maxillofacial Wo Cm  07/11/2013   *RADIOLOGY REPORT*  Clinical Data:  Altered mental status, tripped and fell  CT HEAD WITHOUT CONTRAST CT  MAXILLOFACIAL WITHOUT CONTRAST CT CERVICAL SPINE WITHOUT CONTRAST  Technique:  Multidetector CT imaging of the head, cervical spine, and maxillofacial structures were performed using the standard protocol without intravenous contrast. Multiplanar CT image reconstructions of the cervical spine and maxillofacial structures were also generated.  Comparison:   Prior study from 06/26/2013  CT HEAD  Findings: Diffuse prominence of the CSF containing spaces is similar as compared to the prior study, consistent with atrophy. Scattered and confluent hypodensity within the periventricular white matter is consistent with chronic small vessel ischemic changes.  No acute intracranial hemorrhage or infarct.  No midline shift or mass lesion.  Contusion overlies the left frontal scalp.  Orbits are intact. Mastoid air cells are clear.  IMPRESSION: 1.  No CT evidence of acute intracranial process. 2.  Atrophy with chronic microvascular ischemic changes.  CT MAXILLOFACIAL  Findings:  Study is degraded by motion artifact.Left forehead contusion is noted.  The globes are intact. Paranasal sinuses are clear.  IMPRESSION:  Limited study due to motion.  Left forehead contusion without acute maxillofacial fracture.  Intact globes.  CT CERVICAL SPINE  Findings:   There is no acute fracture listhesis within the cervical spine. One anterolisthesis of C4 and C5.  Severe degenerative intervertebral disc space narrowing is present at C5-6 and C6-7.  Vertebral body heights are preserved. The normal C1-2 articulations are intact.  IMPRESSION: No acute fracture or listhesis.   Original Report Authenticated By: Rise Mu, M.D.    Scheduled Meds: . aspirin EC  81 mg Oral q morning - 10a  . [START ON 07/13/2013] feeding supplement  237 mL Oral BID BM  . levothyroxine  50 mcg Oral QAC breakfast  . multivitamin with minerals  1 tablet Oral q morning - 10a  . senna  1 tablet Oral QHS   Continuous Infusions: . dextrose 5 % and 0.45%  NaCl 75 mL/hr (07/12/13 1331)    Principal Problem:   Closed left hip fracture Active Problems:   HTN (hypertension)   Dementia    Time spent: > 35 minutes    [name]  [facility] Pager [phone] If 7PM-7AM, please contact night-coverage at [email or web address], password [facility] 07/12/2013, 3:22 PM  LOS: 1 day

## 2013-07-12 NOTE — Progress Notes (Signed)
During rounds patient found to have removed catheter and catheter securing device.  Will notify MD.  Will continue to monitor.

## 2013-07-12 NOTE — Consult Note (Signed)
Reason for Consult:    Left femoral neck fracture Referring Physician:     ED Physician  [name] is an 79 y.o. female.  HPI: [name] is a 79 y.o. female who presents [facility] the ED from her [facility] after a witnessed fall. No LOC, patient not on blood thinner, initally complaining of back pain but patient has fairly significant dementia which is complicating her ability to give history.  In the ED, CT head and neck was negative, but X ray of her hip demonstrated new L femoral neck fracture. Hospitalist asked to admit and ortho consulted and will see her in AM.  Dr. Olin was consulted.   Past Medical History  Diagnosis Date  . Hypertension   . Hypothyroidism   . Mental disorder   . DEMENTIA   . Anxiety   . Chest pain 2013  . Weakness   . Dysphagia   . Muscle/ligament disorder   . Speech disorder   . Renal disorder     Past Surgical History  Procedure Laterality Date  . Skin graft      to left leg    Family History  Problem Relation Age of Onset  . Coronary aneurysm Father   . Heart failure Sister     Social History:  reports that she has been smoking.  She has never used smokeless tobacco. She reports that she does not drink alcohol or use illicit drugs.  Allergies:  Allergies  Allergen Reactions  . Aricept [Donepezil Hcl] Nausea And Vomiting     Results for orders placed during the hospital encounter of 07/11/13 (from the past 48 hour(s))  CBC WITH DIFFERENTIAL     Status: Abnormal   Collection Time    07/11/13 11:35 PM      Result Value Range   WBC 11.2 (*) 4.0 - 10.5 K/uL   RBC 3.61 (*) 3.87 - 5.11 MIL/uL   Hemoglobin 11.3 (*) 12.0 - 15.0 g/dL   HCT 33.0 (*) 36.0 - 46.0 %   MCV 91.4  78.0 - 100.0 fL   MCH 31.3  26.0 - 34.0 pg   MCHC 34.2  30.0 - 36.0 g/dL   RDW 15.6 (*) 11.5 - 15.5 %   Platelets 225  150 - 400 K/uL   Neutrophils Relative % 77  43 - 77 %   Neutro Abs 8.6 (*) 1.7 - 7.7 K/uL   Lymphocytes Relative 14  12 - 46 %   Lymphs Abs 1.6  0.7 - 4.0  K/uL   Monocytes Relative 8  3 - 12 %   Monocytes Absolute 0.9  0.1 - 1.0 K/uL   Eosinophils Relative 1  0 - 5 %   Eosinophils Absolute 0.1  0.0 - 0.7 K/uL   Basophils Relative 0  0 - 1 %   Basophils Absolute 0.0  0.0 - 0.1 K/uL  COMPREHENSIVE METABOLIC PANEL     Status: Abnormal   Collection Time    07/11/13 11:35 PM      Result Value Range   Sodium 136  135 - 145 mEq/L   Potassium 3.9  3.5 - 5.1 mEq/L   Chloride 100  96 - 112 mEq/L   CO2 25  19 - 32 mEq/L   Glucose, Bld 115 (*) 70 - 99 mg/dL   BUN 33 (*) 6 - 23 mg/dL   Creatinine, Ser 1.37 (*) 0.50 - 1.10 mg/dL   Calcium 9.2  8.4 - 10.5 mg/dL   Total Protein 6.7    6.0 - 8.3 g/dL   Albumin 3.4 (*) 3.5 - 5.2 g/dL   AST 23  0 - 37 U/L   ALT 18  0 - 35 U/L   Alkaline Phosphatase 88  39 - 117 U/L   Total Bilirubin 0.3  0.3 - 1.2 mg/dL   GFR calc non Af Amer 34 (*) >90 mL/min   GFR calc Af Amer 40 (*) >90 mL/min   Comment: (NOTE)     The eGFR has been calculated using the CKD EPI equation.     This calculation has not been validated in all clinical situations.     eGFR's persistently <90 mL/min signify possible Chronic Kidney     Disease.  PROTIME-INR     Status: None   Collection Time    07/11/13 11:35 PM      Result Value Range   Prothrombin Time 12.0  11.6 - 15.2 seconds   INR 0.90  0.00 - 1.49  URINALYSIS, ROUTINE W REFLEX MICROSCOPIC     Status: Abnormal   Collection Time    07/12/13  2:53 AM      Result Value Range   Color, Urine YELLOW  YELLOW   APPearance CLEAR  CLEAR   Specific Gravity, Urine 1.019  1.005 - 1.030   pH 6.5  5.0 - 8.0   Glucose, UA NEGATIVE  NEGATIVE mg/dL   Hgb urine dipstick SMALL (*) NEGATIVE   Bilirubin Urine NEGATIVE  NEGATIVE   Ketones, ur NEGATIVE  NEGATIVE mg/dL   Protein, ur NEGATIVE  NEGATIVE mg/dL   Urobilinogen, UA 0.2  0.0 - 1.0 mg/dL   Nitrite NEGATIVE  NEGATIVE   Leukocytes, UA SMALL (*) NEGATIVE  URINE MICROSCOPIC-ADD ON     Status: None   Collection Time    07/12/13  2:53 AM       Result Value Range   Squamous Epithelial / LPF RARE  RARE   WBC, UA 7-10  <3 WBC/hpf   RBC / HPF 3-6  <3 RBC/hpf    Dg Chest 2 View  07/11/2013   *RADIOLOGY REPORT*  Clinical Data: Preoperative respiratory exam.  Left hip fracture.  CHEST - 2 VIEW  Comparison: 02/17/2013  Findings: The heart size and pulmonary vascularity are normal.  The patient has chronic interstitial and obstructive lung disease.  No acute osseous abnormality.  IMPRESSION: No acute disease.  Chronic lung disease.   Original Report Authenticated By: James Maxwell, M.D.   Dg Hip Complete Left  07/11/2013   *RADIOLOGY REPORT*  Clinical Data: Left hip pain after fall today.  LEFT HIP - COMPLETE 2+ VIEW  Comparison: 02/17/2013  Findings: Subcapital femoral neck fracture with superior displacement of the distal fracture fragment resulting in varus angulation.  There is no dislocation of the hip joint.  No focal bone lesion is demonstrated.  Old appearing fracture deformities in the right superior and inferior pubic rami.  No acute or displaced pelvic fractures are demonstrated.  The SI joints and symphysis pubis are not displaced.  IMPRESSION: Left femoral neck fracture with varus angulation of the fracture fragments.  Old fracture deformities of the right superior and inferior pubic rami.   Original Report Authenticated By: William Stevens, M.D.   Ct Head Wo Contrast  07/11/2013   *RADIOLOGY REPORT*  Clinical Data:  Altered mental status, tripped and fell  CT HEAD WITHOUT CONTRAST CT MAXILLOFACIAL WITHOUT CONTRAST CT CERVICAL SPINE WITHOUT CONTRAST  Technique:  Multidetector CT imaging of the head, cervical spine, and maxillofacial structures   were performed using the standard protocol without intravenous contrast. Multiplanar CT image reconstructions of the cervical spine and maxillofacial structures were also generated.  Comparison:   Prior study from 06/26/2013  CT HEAD  Findings: Diffuse prominence of the CSF containing spaces  is similar as compared to the prior study, consistent with atrophy. Scattered and confluent hypodensity within the periventricular white matter is consistent with chronic small vessel ischemic changes.  No acute intracranial hemorrhage or infarct.  No midline shift or mass lesion.  Contusion overlies the left frontal scalp.  Orbits are intact. Mastoid air cells are clear.  IMPRESSION: 1.  No CT evidence of acute intracranial process. 2.  Atrophy with chronic microvascular ischemic changes.  CT MAXILLOFACIAL  Findings:  Study is degraded by motion artifact.Left forehead contusion is noted.  The globes are intact. Paranasal sinuses are clear.  IMPRESSION:  Limited study due to motion.  Left forehead contusion without acute maxillofacial fracture.  Intact globes.  CT CERVICAL SPINE  Findings:   There is no acute fracture listhesis within the cervical spine. One anterolisthesis of C4 and C5.  Severe degenerative intervertebral disc space narrowing is present at C5-6 and C6-7.  Vertebral body heights are preserved. The normal C1-2 articulations are intact.  IMPRESSION: No acute fracture or listhesis.   Original Report Authenticated By: Benjamin McClintock, M.D.   Ct Cervical Spine Wo Contrast  07/11/2013   *RADIOLOGY REPORT*  Clinical Data:  Altered mental status, tripped and fell  CT HEAD WITHOUT CONTRAST CT MAXILLOFACIAL WITHOUT CONTRAST CT CERVICAL SPINE WITHOUT CONTRAST  Technique:  Multidetector CT imaging of the head, cervical spine, and maxillofacial structures were performed using the standard protocol without intravenous contrast. Multiplanar CT image reconstructions of the cervical spine and maxillofacial structures were also generated.  Comparison:   Prior study from 06/26/2013  CT HEAD  Findings: Diffuse prominence of the CSF containing spaces is similar as compared to the prior study, consistent with atrophy. Scattered and confluent hypodensity within the periventricular white matter is consistent with  chronic small vessel ischemic changes.  No acute intracranial hemorrhage or infarct.  No midline shift or mass lesion.  Contusion overlies the left frontal scalp.  Orbits are intact. Mastoid air cells are clear.  IMPRESSION: 1.  No CT evidence of acute intracranial process. 2.  Atrophy with chronic microvascular ischemic changes.  CT MAXILLOFACIAL  Findings:  Study is degraded by motion artifact.Left forehead contusion is noted.  The globes are intact. Paranasal sinuses are clear.  IMPRESSION:  Limited study due to motion.  Left forehead contusion without acute maxillofacial fracture.  Intact globes.  CT CERVICAL SPINE  Findings:   There is no acute fracture listhesis within the cervical spine. One anterolisthesis of C4 and C5.  Severe degenerative intervertebral disc space narrowing is present at C5-6 and C6-7.  Vertebral body heights are preserved. The normal C1-2 articulations are intact.  IMPRESSION: No acute fracture or listhesis.   Original Report Authenticated By: Benjamin McClintock, M.D.   Ct Maxillofacial Wo Cm  07/11/2013   *RADIOLOGY REPORT*  Clinical Data:  Altered mental status, tripped and fell  CT HEAD WITHOUT CONTRAST CT MAXILLOFACIAL WITHOUT CONTRAST CT CERVICAL SPINE WITHOUT CONTRAST  Technique:  Multidetector CT imaging of the head, cervical spine, and maxillofacial structures were performed using the standard protocol without intravenous contrast. Multiplanar CT image reconstructions of the cervical spine and maxillofacial structures were also generated.  Comparison:   Prior study from 06/26/2013  CT HEAD  Findings: Diffuse prominence of   the CSF containing spaces is similar as compared to the prior study, consistent with atrophy. Scattered and confluent hypodensity within the periventricular white matter is consistent with chronic small vessel ischemic changes.  No acute intracranial hemorrhage or infarct.  No midline shift or mass lesion.  Contusion overlies the left frontal scalp.  Orbits  are intact. Mastoid air cells are clear.  IMPRESSION: 1.  No CT evidence of acute intracranial process. 2.  Atrophy with chronic microvascular ischemic changes.  CT MAXILLOFACIAL  Findings:  Study is degraded by motion artifact.Left forehead contusion is noted.  The globes are intact. Paranasal sinuses are clear.  IMPRESSION:  Limited study due to motion.  Left forehead contusion without acute maxillofacial fracture.  Intact globes.  CT CERVICAL SPINE  Findings:   There is no acute fracture listhesis within the cervical spine. One anterolisthesis of C4 and C5.  Severe degenerative intervertebral disc space narrowing is present at C5-6 and C6-7.  Vertebral body heights are preserved. The normal C1-2 articulations are intact.  IMPRESSION: No acute fracture or listhesis.   Original Report Authenticated By: Benjamin McClintock, M.D.    Review of Systems  Unable to perform ROS: dementia  Musculoskeletal: Positive for joint pain.  Psychiatric/Behavioral: Positive for memory loss.   Blood pressure 144/76, pulse 71, temperature 99 F (37.2 C), temperature source Oral, resp. rate 18, weight 56.6 kg (124 lb 12.5 oz), SpO2 96.00%. Physical Exam  Constitutional: She appears well-developed and well-nourished.  HENT:  Head: Normocephalic and atraumatic.  Mouth/Throat: Oropharynx is clear and moist.  Eyes: Pupils are equal, round, and reactive to light.  Neck: Neck supple. No JVD present. No tracheal deviation present. No thyromegaly present.  Cardiovascular: Normal rate, regular rhythm, normal heart sounds and intact distal pulses.   Respiratory: Effort normal and breath sounds normal. No respiratory distress. She has no wheezes.  GI: Soft. There is no tenderness. There is no guarding.  Musculoskeletal:       Left hip: She exhibits decreased range of motion, decreased strength, tenderness and bony tenderness.  Lymphadenopathy:    She has no cervical adenopathy.  Skin: Skin is warm and dry.  Psychiatric:  Cognition and memory are impaired.    Assessment/Plan: Left femoral neck fracture    NPO now, Plan is for left hip hemi arthroplasty, per Dr. Olin, today if cleared. Consent will need to be obtained      Danyka Merlin Scott 07/12/2013, 8:56 AM      

## 2013-07-12 NOTE — ED Notes (Signed)
Enrigue Catena contact person home number [phone] and cell number [phone]. Please contact him with any further info.

## 2013-07-13 ENCOUNTER — Inpatient Hospital Stay (HOSPITAL_COMMUNITY): Payer: Medicare Other

## 2013-07-13 ENCOUNTER — Encounter (HOSPITAL_COMMUNITY): Payer: Self-pay | Admitting: Anesthesiology

## 2013-07-13 ENCOUNTER — Encounter (HOSPITAL_COMMUNITY)
Admission: EM | Disposition: A | Discharge status: Skilled Nursing Facility | Payer: Self-pay | Source: Home / Self Care | Attending: Family Medicine

## 2013-07-13 ENCOUNTER — Inpatient Hospital Stay (HOSPITAL_COMMUNITY): Payer: Medicare Other | Admitting: Anesthesiology

## 2013-07-13 DIAGNOSIS — L0291 Cutaneous abscess, unspecified: Secondary | ICD-10-CM

## 2013-07-13 HISTORY — PX: HIP ARTHROPLASTY: SHX981

## 2013-07-13 LAB — CBC WITH DIFFERENTIAL/PLATELET
Basophils Absolute: 0 10*3/uL (ref 0.0–0.1)
Basophils Relative: 0 % (ref 0–1)
Eosinophils Absolute: 0.1 10*3/uL (ref 0.0–0.7)
Eosinophils Relative: 1 % (ref 0–5)
HCT: 31.1 % — ABNORMAL LOW (ref 36.0–46.0)
Hemoglobin: 11 g/dL — ABNORMAL LOW (ref 12.0–15.0)
Lymphocytes Relative: 21 % (ref 12–46)
Lymphs Abs: 1.6 10*3/uL (ref 0.7–4.0)
MCH: 32 pg (ref 26.0–34.0)
MCHC: 35.4 g/dL (ref 30.0–36.0)
MCV: 90.4 fL (ref 78.0–100.0)
Monocytes Absolute: 1.6 10*3/uL — ABNORMAL HIGH (ref 0.1–1.0)
Monocytes Relative: 21 % — ABNORMAL HIGH (ref 3–12)
Neutro Abs: 4.4 10*3/uL (ref 1.7–7.7)
Neutrophils Relative %: 57 % (ref 43–77)
Platelets: 177 10*3/uL (ref 150–400)
RBC: 3.44 MIL/uL — ABNORMAL LOW (ref 3.87–5.11)
RDW: 15.8 % — ABNORMAL HIGH (ref 11.5–15.5)
WBC: 7.7 10*3/uL (ref 4.0–10.5)

## 2013-07-13 LAB — COMPREHENSIVE METABOLIC PANEL
ALT: 14 U/L (ref 0–35)
AST: 20 U/L (ref 0–37)
Albumin: 2.8 g/dL — ABNORMAL LOW (ref 3.5–5.2)
Alkaline Phosphatase: 74 U/L (ref 39–117)
BUN: 18 mg/dL (ref 6–23)
CO2: 25 mEq/L (ref 19–32)
Calcium: 8.6 mg/dL (ref 8.4–10.5)
Chloride: 103 mEq/L (ref 96–112)
Creatinine, Ser: 1.24 mg/dL — ABNORMAL HIGH (ref 0.50–1.10)
GFR calc Af Amer: 45 mL/min — ABNORMAL LOW (ref >90)
GFR calc non Af Amer: 39 mL/min — ABNORMAL LOW (ref >90)
Glucose, Bld: 106 mg/dL — ABNORMAL HIGH (ref 70–99)
Potassium: 3.4 mEq/L — ABNORMAL LOW (ref 3.5–5.1)
Sodium: 136 mEq/L (ref 135–145)
Total Bilirubin: 0.6 mg/dL (ref 0.3–1.2)
Total Protein: 5.9 g/dL — ABNORMAL LOW (ref 6.0–8.3)

## 2013-07-13 LAB — APTT: aPTT: 28 seconds (ref 24–37)

## 2013-07-13 LAB — PROTIME-INR
INR: 1.08 (ref 0.00–1.49)
Prothrombin Time: 13.8 seconds (ref 11.6–15.2)

## 2013-07-13 LAB — ABO/RH: ABO/RH(D): A POS

## 2013-07-13 SURGERY — HEMIARTHROPLASTY, HIP, DIRECT ANTERIOR APPROACH, FOR FRACTURE
Anesthesia: General | Site: Hip | Laterality: Left | Wound class: Clean

## 2013-07-13 MED ORDER — METHOCARBAMOL 100 MG/ML IJ SOLN: 500.0000 mg | Freq: Four times a day (QID) | INTRAVENOUS | Status: DC | PRN

## 2013-07-13 MED ORDER — SODIUM CHLORIDE 0.9 % IR SOLN
Status: DC | PRN
  Administered 2013-07-13: 11:00:00

## 2013-07-13 MED ORDER — MENTHOL 3 MG MT LOZG
1.0000 | LOZENGE | OROMUCOSAL | Status: DC | PRN
  Filled 2013-07-13: qty 9

## 2013-07-13 MED ORDER — ACETAMINOPHEN 325 MG PO TABS: 650.0000 mg | ORAL_TABLET | Freq: Four times a day (QID) | ORAL | Status: DC | PRN

## 2013-07-13 MED ORDER — WARFARIN - PHARMACIST DOSING INPATIENT
Freq: Every day | Status: DC
  Administered 2013-07-15: 20:00:00

## 2013-07-13 MED ORDER — ALUM & MAG HYDROXIDE-SIMETH 200-200-20 MG/5ML PO SUSP: 30.0000 mL | ORAL | Status: DC | PRN

## 2013-07-13 MED ORDER — METHOCARBAMOL 500 MG PO TABS: 500.0000 mg | ORAL_TABLET | Freq: Four times a day (QID) | ORAL | Status: DC | PRN

## 2013-07-13 MED ORDER — MORPHINE SULFATE 2 MG/ML IJ SOLN: 0.5000 mg | INTRAMUSCULAR | Status: DC | PRN

## 2013-07-13 MED ORDER — LACTATED RINGERS IV SOLN: INTRAVENOUS | Status: DC

## 2013-07-13 MED ORDER — ONDANSETRON HCL 4 MG PO TABS: 4.0000 mg | ORAL_TABLET | Freq: Four times a day (QID) | ORAL | Status: DC | PRN

## 2013-07-13 MED ORDER — HYDROCODONE-ACETAMINOPHEN 5-325 MG PO TABS
1.0000 | ORAL_TABLET | Freq: Four times a day (QID) | ORAL | Status: DC | PRN
  Administered 2013-07-13 – 2013-07-16 (×5): 1 via ORAL
  Filled 2013-07-13 (×5): qty 1

## 2013-07-13 MED ORDER — METHOCARBAMOL 500 MG PO TABS
500.0000 mg | ORAL_TABLET | Freq: Four times a day (QID) | ORAL | Status: DC | PRN
  Administered 2013-07-15 – 2013-07-16 (×2): 500 mg via ORAL
  Filled 2013-07-13 (×2): qty 1

## 2013-07-13 MED ORDER — ONDANSETRON HCL 4 MG/2ML IJ SOLN
INTRAMUSCULAR | Status: DC | PRN
  Administered 2013-07-13: 4 mg via INTRAVENOUS

## 2013-07-13 MED ORDER — ACETAMINOPHEN 325 MG PO TABS
650.0000 mg | ORAL_TABLET | Freq: Four times a day (QID) | ORAL | Status: DC | PRN
  Administered 2013-07-15 – 2013-07-16 (×2): 650 mg via ORAL
  Filled 2013-07-13 (×3): qty 2

## 2013-07-13 MED ORDER — HYDROMORPHONE HCL PF 1 MG/ML IJ SOLN: 0.5000 mg | INTRAMUSCULAR | Status: DC | PRN

## 2013-07-13 MED ORDER — CEFAZOLIN SODIUM 1-5 GM-% IV SOLN
1.0000 g | Freq: Four times a day (QID) | INTRAVENOUS | Status: AC
  Administered 2013-07-13 (×2): 1 g via INTRAVENOUS
  Filled 2013-07-13 (×2): qty 50

## 2013-07-13 MED ORDER — ONDANSETRON HCL 4 MG/2ML IJ SOLN: 4.0000 mg | Freq: Four times a day (QID) | INTRAMUSCULAR | Status: DC | PRN

## 2013-07-13 MED ORDER — MENTHOL 3 MG MT LOZG: 1.0000 | LOZENGE | OROMUCOSAL | Status: DC | PRN

## 2013-07-13 MED ORDER — SUCCINYLCHOLINE CHLORIDE 20 MG/ML IJ SOLN
INTRAMUSCULAR | Status: DC | PRN
  Administered 2013-07-13: 60 mg via INTRAVENOUS

## 2013-07-13 MED ORDER — CEFAZOLIN SODIUM-DEXTROSE 2-3 GM-% IV SOLR
INTRAVENOUS | Status: DC | PRN
  Administered 2013-07-13: 2 g via INTRAVENOUS

## 2013-07-13 MED ORDER — ACETAMINOPHEN 650 MG RE SUPP: 650.0000 mg | Freq: Four times a day (QID) | RECTAL | Status: DC | PRN

## 2013-07-13 MED ORDER — PROMETHAZINE HCL 25 MG/ML IJ SOLN: 6.2500 mg | INTRAMUSCULAR | Status: DC | PRN

## 2013-07-13 MED ORDER — PHENOL 1.4 % MT LIQD: 1.0000 | OROMUCOSAL | Status: DC | PRN

## 2013-07-13 MED ORDER — WARFARIN SODIUM 2.5 MG PO TABS
2.5000 mg | ORAL_TABLET | Freq: Once | ORAL | Status: AC
  Administered 2013-07-13: 2.5 mg via ORAL
  Filled 2013-07-13: qty 1

## 2013-07-13 MED ORDER — LACTATED RINGERS IV SOLN
INTRAVENOUS | Status: DC | PRN
  Administered 2013-07-13 (×2): via INTRAVENOUS

## 2013-07-13 MED ORDER — LIDOCAINE HCL (CARDIAC) 20 MG/ML IV SOLN
INTRAVENOUS | Status: DC | PRN
  Administered 2013-07-13: 50 mg via INTRAVENOUS

## 2013-07-13 MED ORDER — PHENYLEPHRINE HCL 10 MG/ML IJ SOLN
INTRAMUSCULAR | Status: DC | PRN
  Administered 2013-07-13: 40 ug via INTRAVENOUS

## 2013-07-13 MED ORDER — THROMBIN 5000 UNITS EX SOLR
OROMUCOSAL | Status: DC | PRN
  Administered 2013-07-13 (×2): via TOPICAL

## 2013-07-13 MED ORDER — FERROUS SULFATE 325 (65 FE) MG PO TABS
325.0000 mg | ORAL_TABLET | Freq: Three times a day (TID) | ORAL | Status: DC
  Administered 2013-07-13 – 2013-07-16 (×8): 325 mg via ORAL
  Filled 2013-07-13 (×12): qty 1

## 2013-07-13 MED ORDER — LACTATED RINGERS IV SOLN
INTRAVENOUS | Status: DC
  Administered 2013-07-13 – 2013-07-14 (×3): via INTRAVENOUS

## 2013-07-13 MED ORDER — HYDROCODONE-ACETAMINOPHEN 5-325 MG PO TABS: 1.0000 | ORAL_TABLET | ORAL | Status: DC | PRN

## 2013-07-13 MED ORDER — FENTANYL CITRATE 0.05 MG/ML IJ SOLN
25.0000 ug | INTRAMUSCULAR | Status: DC | PRN
  Administered 2013-07-13: 50 ug via INTRAVENOUS

## 2013-07-13 MED ORDER — FERROUS SULFATE 325 (65 FE) MG PO TABS: 325.0000 mg | ORAL_TABLET | Freq: Three times a day (TID) | ORAL | Status: DC

## 2013-07-13 MED ORDER — COUMADIN BOOK
Freq: Once | Status: AC
  Administered 2013-07-13: 18:00:00
  Filled 2013-07-13: qty 1

## 2013-07-13 MED ORDER — CEFAZOLIN SODIUM-DEXTROSE 2-3 GM-% IV SOLR: 2.0000 g | Freq: Four times a day (QID) | INTRAVENOUS | Status: DC

## 2013-07-13 MED ORDER — PROPOFOL 10 MG/ML IV BOLUS
INTRAVENOUS | Status: DC | PRN
  Administered 2013-07-13: 60 mg via INTRAVENOUS

## 2013-07-13 MED ORDER — FENTANYL CITRATE 0.05 MG/ML IJ SOLN
INTRAMUSCULAR | Status: DC | PRN
  Administered 2013-07-13 (×2): 50 ug via INTRAVENOUS

## 2013-07-13 MED ORDER — WARFARIN VIDEO: Freq: Once | Status: DC

## 2013-07-13 SURGICAL SUPPLY — 42 items
ADH SKN CLS APL DERMABOND .7 (GAUZE/BANDAGES/DRESSINGS) ×1
BAG SPEC THK2 15X12 ZIP CLS (MISCELLANEOUS) ×1
BAG ZIPLOCK 12X15 (MISCELLANEOUS) ×2 IMPLANT
BLADE SAW SAG 73X25 THK (BLADE) ×1
BLADE SAW SGTL 73X25 THK (BLADE) ×1 IMPLANT
CAPT HIP HD POR BIPOL/UNIPOL ×1 IMPLANT
CLOTH BEACON ORANGE TIMEOUT ST (SAFETY) ×2 IMPLANT
DERMABOND ADVANCED (GAUZE/BANDAGES/DRESSINGS) ×1
DERMABOND ADVANCED .7 DNX12 (GAUZE/BANDAGES/DRESSINGS) IMPLANT
DRAPE INCISE IOBAN 66X45 STRL (DRAPES) ×2 IMPLANT
DRAPE ORTHO SPLIT 77X108 STRL (DRAPES) ×4
DRAPE SURG ORHT 6 SPLT 77X108 (DRAPES) ×2 IMPLANT
DRSG AQUACEL AG ADV 3.5X 4 (GAUZE/BANDAGES/DRESSINGS) ×2 IMPLANT
DRSG AQUACEL AG ADV 3.5X14 (GAUZE/BANDAGES/DRESSINGS) ×1 IMPLANT
ELECT REM PT RETURN 9FT ADLT (ELECTROSURGICAL) ×2
ELECTRODE REM PT RTRN 9FT ADLT (ELECTROSURGICAL) ×1 IMPLANT
EVACUATOR 1/8 PVC DRAIN (DRAIN) ×2 IMPLANT
GLOVE BIO SURGEON STRL SZ8 (GLOVE) ×2 IMPLANT
GLOVE BIOGEL PI IND STRL 8 (GLOVE) ×1 IMPLANT
GLOVE BIOGEL PI INDICATOR 8 (GLOVE) ×1
GLOVE ECLIPSE 8.0 STRL XLNG CF (GLOVE) ×2 IMPLANT
GLOVE SURG ORTHO 9.0 STRL STRW (GLOVE) ×2 IMPLANT
GOWN PREVENTION PLUS LG XLONG (DISPOSABLE) ×6 IMPLANT
GOWN STRL REIN XL XLG (GOWN DISPOSABLE) ×4 IMPLANT
HANDPIECE INTERPULSE COAX TIP (DISPOSABLE)
IMMOBILIZER KNEE 20 (SOFTGOODS)
IMMOBILIZER KNEE 20 THIGH 36 (SOFTGOODS) IMPLANT
PACK TOTAL JOINT (CUSTOM PROCEDURE TRAY) ×2 IMPLANT
PASSER SUT SWANSON 36MM LOOP (INSTRUMENTS) IMPLANT
POSITIONER SURGICAL ARM (MISCELLANEOUS) ×2 IMPLANT
SET HNDPC FAN SPRY TIP SCT (DISPOSABLE) IMPLANT
STAPLER VISISTAT 35W (STAPLE) ×2 IMPLANT
SUT ETHIBOND NAB CT1 #1 30IN (SUTURE) ×4 IMPLANT
SUT VIC AB 0 CT1 27 (SUTURE) ×6
SUT VIC AB 0 CT1 27XBRD ANTBC (SUTURE) ×3 IMPLANT
SUT VIC AB 1 CT1 27 (SUTURE) ×12
SUT VIC AB 1 CT1 27XBRD ANTBC (SUTURE) ×6 IMPLANT
SUT VIC AB 2-0 CT1 27 (SUTURE) ×4
SUT VIC AB 2-0 CT1 TAPERPNT 27 (SUTURE) ×2 IMPLANT
TOWEL OR 17X26 10 PK STRL BLUE (TOWEL DISPOSABLE) ×4 IMPLANT
TOWER CARTRIDGE SMART MIX (DISPOSABLE) ×1 IMPLANT
TRAY FOLEY CATH 14FRSI W/METER (CATHETERS) ×1 IMPLANT

## 2013-07-13 NOTE — Brief Op Note (Signed)
07/11/2013 - 07/13/2013  11:52 AM  PATIENT:  [name]  79 y.o. female  PRE-OPERATIVE DIAGNOSIS:  left hip fracture,Displaced Femoral Neck Fracture  POST-OPERATIVE DIAGNOSIS:  Displace Femoral Neck Fracture on the Left.  PROCEDURE:  Procedure(s): ARTHROPLASTY BIPOLAR HIP (Left)  SURGEON:  Surgeon(s) and Role:    * Jacki Cones, MD - Primary  PHYSICIAN ASSISTANT: Freddie Breech PA  ASSISTANTS: Freddie Breech PA   ANESTHESIA:   general  EBL:  Total I/O In: 243.8 [I.V.:243.8] Out: 700 [Urine:600; Blood:100]  BLOOD ADMINISTERED:none  DRAINS: none   LOCAL MEDICATIONS USED:  NONE  SPECIMEN:  No Specimen  DISPOSITION OF SPECIMEN:  N/A  COUNTS:  YES  TOURNIQUET:  * No tourniquets in log *  DICTATION: .Other Dictation: Dictation Number [id number]  PLAN OF CARE: Admit to inpatient   PATIENT DISPOSITION:  Stable in OR   Delay start of Pharmacological VTE agent (>24hrs) due to surgical blood loss or risk of bleeding: yes

## 2013-07-13 NOTE — Transfer of Care (Signed)
Immediate Anesthesia Transfer of Care Note  Patient: [name]  Procedure(s) Performed: Procedure(s) (LRB): ARTHROPLASTY BIPOLAR HIP (Left)  Patient Location: PACU  Anesthesia Type: General  Level of Consciousness: sedated, patient cooperative and responds to stimulaton  Airway & Oxygen Therapy: Patient Spontanous Breathing and Patient connected to face mask oxgen  Post-op Assessment: Report given to PACU RN and Post -op Vital signs reviewed and stable  Post vital signs: Reviewed and stable  Complications: No apparent anesthesia complications

## 2013-07-13 NOTE — H&P (View-Only) (Signed)
Reason for Consult:    Left femoral neck fracture Referring Physician:     ED Physician  [name] is an 79 y.o. female.  HPI: [name] is a 79 y.o. female who presents to the ED from her SNF after a witnessed fall. No LOC, patient not on blood thinner, initally complaining of back pain but patient has fairly significant dementia which is complicating her ability to give history.  In the ED, CT head and neck was negative, but X ray of her hip demonstrated new L femoral neck fracture. Hospitalist asked to admit and ortho consulted and will see her in AM.  Dr. Charlann Boxer was consulted.   Past Medical History  Diagnosis Date  . Hypertension   . Hypothyroidism   . Mental disorder   . DEMENTIA   . Anxiety   . Chest pain 2013  . Weakness   . Dysphagia   . Muscle/ligament disorder   . Speech disorder   . Renal disorder     Past Surgical History  Procedure Laterality Date  . Skin graft      to left leg    Family History  Problem Relation Age of Onset  . Coronary aneurysm Father   . Heart failure Sister     Social History:  reports that she has been smoking.  She has never used smokeless tobacco. She reports that she does not drink alcohol or use illicit drugs.  Allergies:  Allergies  Allergen Reactions  . Aricept [Donepezil Hcl] Nausea And Vomiting     Results for orders placed during the hospital encounter of 07/11/13 (from the past 48 hour(s))  CBC WITH DIFFERENTIAL     Status: Abnormal   Collection Time    07/11/13 11:35 PM      Result Value Range   WBC 11.2 (*) 4.0 - 10.5 K/uL   RBC 3.61 (*) 3.87 - 5.11 MIL/uL   Hemoglobin 11.3 (*) 12.0 - 15.0 g/dL   HCT 16.1 (*) 09.6 - 04.5 %   MCV 91.4  78.0 - 100.0 fL   MCH 31.3  26.0 - 34.0 pg   MCHC 34.2  30.0 - 36.0 g/dL   RDW 40.9 (*) 81.1 - 91.4 %   Platelets 225  150 - 400 K/uL   Neutrophils Relative % 77  43 - 77 %   Neutro Abs 8.6 (*) 1.7 - 7.7 K/uL   Lymphocytes Relative 14  12 - 46 %   Lymphs Abs 1.6  0.7 - 4.0  K/uL   Monocytes Relative 8  3 - 12 %   Monocytes Absolute 0.9  0.1 - 1.0 K/uL   Eosinophils Relative 1  0 - 5 %   Eosinophils Absolute 0.1  0.0 - 0.7 K/uL   Basophils Relative 0  0 - 1 %   Basophils Absolute 0.0  0.0 - 0.1 K/uL  COMPREHENSIVE METABOLIC PANEL     Status: Abnormal   Collection Time    07/11/13 11:35 PM      Result Value Range   Sodium 136  135 - 145 mEq/L   Potassium 3.9  3.5 - 5.1 mEq/L   Chloride 100  96 - 112 mEq/L   CO2 25  19 - 32 mEq/L   Glucose, Bld 115 (*) 70 - 99 mg/dL   BUN 33 (*) 6 - 23 mg/dL   Creatinine, Ser 7.82 (*) 0.50 - 1.10 mg/dL   Calcium 9.2  8.4 - 95.6 mg/dL   Total Protein 6.7  6.0 - 8.3 g/dL   Albumin 3.4 (*) 3.5 - 5.2 g/dL   AST 23  0 - 37 U/L   ALT 18  0 - 35 U/L   Alkaline Phosphatase 88  39 - 117 U/L   Total Bilirubin 0.3  0.3 - 1.2 mg/dL   GFR calc non Af Amer 34 (*) >90 mL/min   GFR calc Af Amer 40 (*) >90 mL/min   Comment: (NOTE)     The eGFR has been calculated using the CKD EPI equation.     This calculation has not been validated in all clinical situations.     eGFR's persistently <90 mL/min signify possible Chronic Kidney     Disease.  PROTIME-INR     Status: None   Collection Time    07/11/13 11:35 PM      Result Value Range   Prothrombin Time 12.0  11.6 - 15.2 seconds   INR 0.90  0.00 - 1.49  URINALYSIS, ROUTINE W REFLEX MICROSCOPIC     Status: Abnormal   Collection Time    07/12/13  2:53 AM      Result Value Range   Color, Urine YELLOW  YELLOW   APPearance CLEAR  CLEAR   Specific Gravity, Urine 1.019  1.005 - 1.030   pH 6.5  5.0 - 8.0   Glucose, UA NEGATIVE  NEGATIVE mg/dL   Hgb urine dipstick SMALL (*) NEGATIVE   Bilirubin Urine NEGATIVE  NEGATIVE   Ketones, ur NEGATIVE  NEGATIVE mg/dL   Protein, ur NEGATIVE  NEGATIVE mg/dL   Urobilinogen, UA 0.2  0.0 - 1.0 mg/dL   Nitrite NEGATIVE  NEGATIVE   Leukocytes, UA SMALL (*) NEGATIVE  URINE MICROSCOPIC-ADD ON     Status: None   Collection Time    07/12/13  2:53 AM       Result Value Range   Squamous Epithelial / LPF RARE  RARE   WBC, UA 7-10  <3 WBC/hpf   RBC / HPF 3-6  <3 RBC/hpf    Dg Chest 2 View  07/11/2013   *RADIOLOGY REPORT*  Clinical Data: Preoperative respiratory exam.  Left hip fracture.  CHEST - 2 VIEW  Comparison: 02/17/2013  Findings: The heart size and pulmonary vascularity are normal.  The patient has chronic interstitial and obstructive lung disease.  No acute osseous abnormality.  IMPRESSION: No acute disease.  Chronic lung disease.   Original Report Authenticated By: Francene Boyers, M.D.   Dg Hip Complete Left  07/11/2013   *RADIOLOGY REPORT*  Clinical Data: Left hip pain after fall today.  LEFT HIP - COMPLETE 2+ VIEW  Comparison: 02/17/2013  Findings: Subcapital femoral neck fracture with superior displacement of the distal fracture fragment resulting in varus angulation.  There is no dislocation of the hip joint.  No focal bone lesion is demonstrated.  Old appearing fracture deformities in the right superior and inferior pubic rami.  No acute or displaced pelvic fractures are demonstrated.  The SI joints and symphysis pubis are not displaced.  IMPRESSION: Left femoral neck fracture with varus angulation of the fracture fragments.  Old fracture deformities of the right superior and inferior pubic rami.   Original Report Authenticated By: Burman Nieves, M.D.   Ct Head Wo Contrast  07/11/2013   *RADIOLOGY REPORT*  Clinical Data:  Altered mental status, tripped and fell  CT HEAD WITHOUT CONTRAST CT MAXILLOFACIAL WITHOUT CONTRAST CT CERVICAL SPINE WITHOUT CONTRAST  Technique:  Multidetector CT imaging of the head, cervical spine, and maxillofacial structures  were performed using the standard protocol without intravenous contrast. Multiplanar CT image reconstructions of the cervical spine and maxillofacial structures were also generated.  Comparison:   Prior study from 06/26/2013  CT HEAD  Findings: Diffuse prominence of the CSF containing spaces  is similar as compared to the prior study, consistent with atrophy. Scattered and confluent hypodensity within the periventricular white matter is consistent with chronic small vessel ischemic changes.  No acute intracranial hemorrhage or infarct.  No midline shift or mass lesion.  Contusion overlies the left frontal scalp.  Orbits are intact. Mastoid air cells are clear.  IMPRESSION: 1.  No CT evidence of acute intracranial process. 2.  Atrophy with chronic microvascular ischemic changes.  CT MAXILLOFACIAL  Findings:  Study is degraded by motion artifact.Left forehead contusion is noted.  The globes are intact. Paranasal sinuses are clear.  IMPRESSION:  Limited study due to motion.  Left forehead contusion without acute maxillofacial fracture.  Intact globes.  CT CERVICAL SPINE  Findings:   There is no acute fracture listhesis within the cervical spine. One anterolisthesis of C4 and C5.  Severe degenerative intervertebral disc space narrowing is present at C5-6 and C6-7.  Vertebral body heights are preserved. The normal C1-2 articulations are intact.  IMPRESSION: No acute fracture or listhesis.   Original Report Authenticated By: Rise Mu, M.D.   Ct Cervical Spine Wo Contrast  07/11/2013   *RADIOLOGY REPORT*  Clinical Data:  Altered mental status, tripped and fell  CT HEAD WITHOUT CONTRAST CT MAXILLOFACIAL WITHOUT CONTRAST CT CERVICAL SPINE WITHOUT CONTRAST  Technique:  Multidetector CT imaging of the head, cervical spine, and maxillofacial structures were performed using the standard protocol without intravenous contrast. Multiplanar CT image reconstructions of the cervical spine and maxillofacial structures were also generated.  Comparison:   Prior study from 06/26/2013  CT HEAD  Findings: Diffuse prominence of the CSF containing spaces is similar as compared to the prior study, consistent with atrophy. Scattered and confluent hypodensity within the periventricular white matter is consistent with  chronic small vessel ischemic changes.  No acute intracranial hemorrhage or infarct.  No midline shift or mass lesion.  Contusion overlies the left frontal scalp.  Orbits are intact. Mastoid air cells are clear.  IMPRESSION: 1.  No CT evidence of acute intracranial process. 2.  Atrophy with chronic microvascular ischemic changes.  CT MAXILLOFACIAL  Findings:  Study is degraded by motion artifact.Left forehead contusion is noted.  The globes are intact. Paranasal sinuses are clear.  IMPRESSION:  Limited study due to motion.  Left forehead contusion without acute maxillofacial fracture.  Intact globes.  CT CERVICAL SPINE  Findings:   There is no acute fracture listhesis within the cervical spine. One anterolisthesis of C4 and C5.  Severe degenerative intervertebral disc space narrowing is present at C5-6 and C6-7.  Vertebral body heights are preserved. The normal C1-2 articulations are intact.  IMPRESSION: No acute fracture or listhesis.   Original Report Authenticated By: Rise Mu, M.D.   Ct Maxillofacial Wo Cm  07/11/2013   *RADIOLOGY REPORT*  Clinical Data:  Altered mental status, tripped and fell  CT HEAD WITHOUT CONTRAST CT MAXILLOFACIAL WITHOUT CONTRAST CT CERVICAL SPINE WITHOUT CONTRAST  Technique:  Multidetector CT imaging of the head, cervical spine, and maxillofacial structures were performed using the standard protocol without intravenous contrast. Multiplanar CT image reconstructions of the cervical spine and maxillofacial structures were also generated.  Comparison:   Prior study from 06/26/2013  CT HEAD  Findings: Diffuse prominence of  the CSF containing spaces is similar as compared to the prior study, consistent with atrophy. Scattered and confluent hypodensity within the periventricular white matter is consistent with chronic small vessel ischemic changes.  No acute intracranial hemorrhage or infarct.  No midline shift or mass lesion.  Contusion overlies the left frontal scalp.  Orbits  are intact. Mastoid air cells are clear.  IMPRESSION: 1.  No CT evidence of acute intracranial process. 2.  Atrophy with chronic microvascular ischemic changes.  CT MAXILLOFACIAL  Findings:  Study is degraded by motion artifact.Left forehead contusion is noted.  The globes are intact. Paranasal sinuses are clear.  IMPRESSION:  Limited study due to motion.  Left forehead contusion without acute maxillofacial fracture.  Intact globes.  CT CERVICAL SPINE  Findings:   There is no acute fracture listhesis within the cervical spine. One anterolisthesis of C4 and C5.  Severe degenerative intervertebral disc space narrowing is present at C5-6 and C6-7.  Vertebral body heights are preserved. The normal C1-2 articulations are intact.  IMPRESSION: No acute fracture or listhesis.   Original Report Authenticated By: Rise Mu, M.D.    Review of Systems  Unable to perform ROS: dementia  Musculoskeletal: Positive for joint pain.  Psychiatric/Behavioral: Positive for memory loss.   Blood pressure 144/76, pulse 71, temperature 99 F (37.2 C), temperature source Oral, resp. rate 18, weight 56.6 kg (124 lb 12.5 oz), SpO2 96.00%. Physical Exam  Constitutional: She appears well-developed and well-nourished.  HENT:  Head: Normocephalic and atraumatic.  Mouth/Throat: Oropharynx is clear and moist.  Eyes: Pupils are equal, round, and reactive to light.  Neck: Neck supple. No JVD present. No tracheal deviation present. No thyromegaly present.  Cardiovascular: Normal rate, regular rhythm, normal heart sounds and intact distal pulses.   Respiratory: Effort normal and breath sounds normal. No respiratory distress. She has no wheezes.  GI: Soft. There is no tenderness. There is no guarding.  Musculoskeletal:       Left hip: She exhibits decreased range of motion, decreased strength, tenderness and bony tenderness.  Lymphadenopathy:    She has no cervical adenopathy.  Skin: Skin is warm and dry.  Psychiatric:  Cognition and memory are impaired.    Assessment/Plan: Left femoral neck fracture    NPO now, Plan is for left hip hemi arthroplasty, per Dr. Charlann Boxer, today if cleared. Consent will need to be obtained      Gerrit Halls 07/12/2013, 8:56 AM

## 2013-07-13 NOTE — Progress Notes (Signed)
Dr. Cena Benton made aware pt has rectal temp 102.5; tylenol po given; RN continuing to monitor.

## 2013-07-13 NOTE — Interval H&P Note (Signed)
History and Physical Interval Note:  07/13/2013 10:21 AM  [name]  has presented today for surgery, with the diagnosis of left hip fracture  The various methods of treatment have been discussed with the patient and family. After consideration of risks, benefits and other options for treatment, the patient has consented to  Procedure(s): ARTHROPLASTY BIPOLAR HIP (Left) as a surgical intervention .  The patient's history has been reviewed, patient examined, no change in status, stable for surgery.  I have reviewed the patient's chart and labs.  Questions were answered to the patient's satisfaction.     [name]

## 2013-07-13 NOTE — Anesthesia Preprocedure Evaluation (Addendum)
Anesthesia Evaluation  Patient identified by MRN, date of birth, ID band Patient confused  General Assessment Comment:Pt non-verbal, severe dementia  Reviewed: Allergy & Precautions, H&P , NPO status , Patient's Chart, lab work & pertinent test results  Airway Mallampati: II TM Distance: >3 FB Neck ROM: Limited    Dental  (+) Partial Upper   Pulmonary neg pulmonary ROS,  breath sounds clear to auscultation  Pulmonary exam normal + decreased breath sounds      Cardiovascular hypertension, Pt. on medications Rhythm:Regular Rate:Normal  Study Conclusions  - Left ventricle: The cavity size was normal. Wall thickness   was normal. Systolic function was normal. The estimated   ejection fraction was in the range of 55% to 65%. Wall   motion was normal; there were no regional wall motion   abnormalities. Doppler parameters are consistent with   abnormal left ventricular relaxation (grade 1 diastolic   dysfunction).    Neuro/Psych DEMENTIA negative psych ROS   GI/Hepatic negative GI ROS, Neg liver ROS,   Endo/Other  Hypothyroidism   Renal/GU Renal InsufficiencyRenal disease  negative genitourinary   Musculoskeletal negative musculoskeletal ROS (+)   Abdominal   Peds negative pediatric ROS (+)  Hematology negative hematology ROS (+)   Anesthesia Other Findings   Reproductive/Obstetrics negative OB ROS                         Anesthesia Physical Anesthesia Plan  ASA: III  Anesthesia Plan: General   Post-op Pain Management:    Induction: Intravenous  Airway Management Planned: Oral ETT  Additional Equipment:   Intra-op Plan:   Post-operative Plan: Extubation in OR  Informed Consent: I have reviewed the patients History and Physical, chart, labs and discussed the procedure including the risks, benefits and alternatives for the proposed anesthesia with the patient or authorized  representative who has indicated his/her understanding and acceptance.   Dental advisory given  Plan Discussed with: CRNA and Surgeon  Anesthesia Plan Comments:         Anesthesia Quick Evaluation

## 2013-07-13 NOTE — Progress Notes (Signed)
ANTICOAGULATION CONSULT NOTE - Initial Consult  Pharmacy Consult for warfarin Indication: VTE prophylaxis  Allergies  Allergen Reactions  . Aricept [Donepezil Hcl] Nausea And Vomiting    Patient Measurements: Height: 5\' 5"  (165.1 cm) Weight: 120 lb 6.4 oz (54.613 kg) IBW/kg (Calculated) : 57  Vital Signs: Temp: 97.7 F (36.5 C) (08/23 1328) Temp src: Axillary (08/23 0629) BP: 118/74 mmHg (08/23 1328) Pulse Rate: 71 (08/23 1328)  Labs:  Recent Labs  07/11/13 2335 07/13/13 0540  HGB 11.3* 11.0*  HCT 33.0* 31.1*  PLT 225 177  APTT  --  28  LABPROT 12.0 13.8  INR 0.90 1.08  CREATININE 1.37* 1.24*    Estimated Creatinine Clearance: 29.1 ml/min (by C-G formula based on Cr of 1.24).   Medical History: Past Medical History  Diagnosis Date  . Hypertension   . Hypothyroidism   . Mental disorder   . DEMENTIA   . Anxiety   . Chest pain 2013  . Weakness   . Dysphagia   . Muscle/ligament disorder   . Speech disorder   . Renal disorder   . Fracture of right superior rim of pubis with routine healing     right superior and pubi rami fracture    Medications:  Scheduled:  .  ceFAZolin (ANCEF) IV  1 g Intravenous Q6H  .  ceFAZolin (ANCEF) IV  2 g Intravenous Q6H  . feeding supplement  237 mL Oral BID BM  . ferrous sulfate  325 mg Oral TID PC  . ferrous sulfate  325 mg Oral TID PC  . levothyroxine  50 mcg Oral QAC breakfast  . multivitamin with minerals  1 tablet Oral q morning - 10a  . senna  1 tablet Oral QHS   Infusions:  . lactated ringers    . lactated ringers      Assessment: 79 yo from SNF presented to ER after witnessed fall found to have a left femoral neck fracture. Patient is now s/p L arthroplasty bipolar hip for this fracture performed today 8/23. To begin warfarin for DVT ppx post op tonight  Goal of Therapy:  INR 2-3   Plan:  1) Warfarin 2.5mg  x 1 tonight 2) Daily INR   Hessie Knows, PharmD, BCPS Pager [phone] 07/13/2013 1:47  PM

## 2013-07-13 NOTE — Op Note (Signed)
NAME:  [name], [name]                  ACCOUNT NO.:  [id number]  MEDICAL RECORD NO.:  [id number]  LOCATION:  [facility]                         FACILITY:  [facility]  PHYSICIAN:  Georges Lynch. Teniyah Seivert, M.D.DATE OF BIRTH:  04-13-1927  DATE OF PROCEDURE:  07/13/2013 DATE OF DISCHARGE:                              OPERATIVE REPORT   SURGEON:  Georges Lynch. Darrelyn Hillock, MD  ASSISTANT:  Lanney Gins, PA  PREOPERATIVE DIAGNOSIS: Displaced femoral neck fracture on the left.  POSTOPERATIVE DIAGNOSIS:  Displaced femoral neck fracture on the left.  OPERATION: 1. Excision of left femoral head. 2. DePuy Tri-Lock high offset stem size 5 and +0 neck with a 50 mm     diameter unipolar ball.  DESCRIPTION OF PROCEDURE:  Under general anesthesia, routine orthopedic prep and draping of the left hip was carried out with the right hip down.  I did initial prep of the left hip and under a sterile prep, 2 preps were done.  The patient was given 2 g of IV Ancef.  At this time, the appropriate time-out was carried out and then I also marked the appropriate left leg in the holding area.  A posterior lateral approach to hip was carried out.  Bleeders were identified and cauterized.  I then went down and incised the iliotibial band.  I then partially detached the external rotators.  I then cut my femoral neck after protecting it with the Mueller retractor.  I cut the femoral neck to appropriate length.  I then utilized the box osteotome to remove the lateral cancellous bone from the trochanter and then used the widening reamer, then a canal finer.  I thoroughly irrigated out the canal.  Then I rasped the canal up to a size 5 Tri-Lock stem high offset.  Following that, I then removed the femoral head from the acetabulum with the appropriate instruments.  I measured the femoral head to be a 50 mm in diameter.  I then debrided the acetabulum with the soft tissue, cauterized the bleeders in the acetabulum.  I then utilized a trial  50 mm cup and it fit quite nicely.  I then removed that.  I then inserted my permanent high offset Tri-Lock stem after irrigating out the femoral canal.  Once this was done, I inserted my permanent +0 femoral neck length and the 50 mm unipolar ball.  I cleared the acetabulum, held the soft tissue out of harms way, and then reduced the hip.  We had excellent motion and excellent leg lengths.  The hip was stable.  I irrigated the wound and then I inserted some thrombin-soaked Gelfoam and we had good control of the bleeding.  I then reapproximated the capsule with #1 Vicryl suture. Remaining part of the wound was closed in usual fashion.  Sterile dressings were applied.          ______________________________ Georges Lynch Darrelyn Hillock, M.D.     RAG/MEDQ  D:  07/13/2013  T:  07/13/2013  Job:  [id number]

## 2013-07-13 NOTE — Progress Notes (Signed)
[facility] PROGRESS NOTE  [name] RUE:[id number] DOB: 28-May-1927 DOA: 07/11/2013 PCP: Florentina Jenny, MD  Assessment/Plan:  1. Closed left hip fracture - from mechanical fall, ortho surgery on board, lab work up and EKG ordered and shows normal sinus rhythm with no ST elevations or depressions.  - Continue on hip fracture pathway, pain control, NPO, DVT ppx per surgeons recs. - Patient to operating room today 8/23  2 HTN - continue home meds   3 Dementia - chronic and appears to be baseline.  4. Hypothyroidism: stable on home synthroid.  Code Status: full Family Communication: No family at bedside Disposition Plan: Pending plans by surgical team.   Consultants:  Ortho  Procedures:  pending  Antibiotics:  None  HPI/Subjective: No new complaints. No acute issues overnight.  Objective: Filed Vitals:   07/13/13 0629  BP: 117/65  Pulse: 76  Temp: 99.9 F (37.7 C)  Resp: 16    Intake/Output Summary (Last 24 hours) at 07/13/13 1124 Last data filed at 07/13/13 1039  Gross per 24 hour  Intake 1818.75 ml  Output   2600 ml  Net -781.25 ml   Filed Weights   07/12/13 0116 07/12/13 1433  Weight: 56.6 kg (124 lb 12.5 oz) 54.613 kg (120 lb 6.4 oz)    Exam:   General:  Pt in NAD, alert and Awake  Cardiovascular: RRR, no MRG  Respiratory: CTA BL, no wheezes  Abdomen: soft, NT, ND  Musculoskeletal: Pain at left hip  Data Reviewed: Basic Metabolic Panel:  Recent Labs Lab 07/11/13 2335 07/13/13 0540  NA 136 136  K 3.9 3.4*  CL 100 103  CO2 25 25  GLUCOSE 115* 106*  BUN 33* 18  CREATININE 1.37* 1.24*  CALCIUM 9.2 8.6   Liver Function Tests:  Recent Labs Lab 07/11/13 2335 07/13/13 0540  AST 23 20  ALT 18 14  ALKPHOS 88 74  BILITOT 0.3 0.6  PROT 6.7 5.9*  ALBUMIN 3.4* 2.8*   No results found for this basename: LIPASE, AMYLASE,  in the last 168 hours No results found for this basename: AMMONIA,  in the last 168  hours CBC:  Recent Labs Lab 07/11/13 2335 07/13/13 0540  WBC 11.2* 7.7  NEUTROABS 8.6* 4.4  HGB 11.3* 11.0*  HCT 33.0* 31.1*  MCV 91.4 90.4  PLT 225 177   Cardiac Enzymes: No results found for this basename: CKTOTAL, CKMB, CKMBINDEX, TROPONINI,  in the last 168 hours BNP (last 3 results) No results found for this basename: PROBNP,  in the last 8760 hours CBG: No results found for this basename: GLUCAP,  in the last 168 hours  Recent Results (from the past 240 hour(s))  SURGICAL PCR SCREEN     Status: None   Collection Time    07/12/13  9:07 AM      Result Value Range Status   MRSA, PCR NEGATIVE  NEGATIVE Final   Staphylococcus aureus NEGATIVE  NEGATIVE Final   Comment:            The Xpert SA Assay (FDA     approved for NASAL specimens     in patients over 36 years of age),     is one component of     a comprehensive surveillance     program.  Test performance has     been validated by [employer] for patients greater     than or equal to 71 year old.     It  is not intended     to diagnose infection nor to     guide or monitor treatment.     Studies: Dg Chest 2 View  07/11/2013   *RADIOLOGY REPORT*  Clinical Data: Preoperative respiratory exam.  Left hip fracture.  CHEST - 2 VIEW  Comparison: 02/17/2013  Findings: The heart size and pulmonary vascularity are normal.  The patient has chronic interstitial and obstructive lung disease.  No acute osseous abnormality.  IMPRESSION: No acute disease.  Chronic lung disease.   Original Report Authenticated By: Francene Boyers, M.D.   Dg Hip Complete Left  07/11/2013   *RADIOLOGY REPORT*  Clinical Data: Left hip pain after fall today.  LEFT HIP - COMPLETE 2+ VIEW  Comparison: 02/17/2013  Findings: Subcapital femoral neck fracture with superior displacement of the distal fracture fragment resulting in varus angulation.  There is no dislocation of the hip joint.  No focal bone lesion is demonstrated.  Old appearing fracture  deformities in the right superior and inferior pubic rami.  No acute or displaced pelvic fractures are demonstrated.  The SI joints and symphysis pubis are not displaced.  IMPRESSION: Left femoral neck fracture with varus angulation of the fracture fragments.  Old fracture deformities of the right superior and inferior pubic rami.   Original Report Authenticated By: Burman Nieves, M.D.   Ct Head Wo Contrast  07/11/2013   *RADIOLOGY REPORT*  Clinical Data:  Altered mental status, tripped and fell  CT HEAD WITHOUT CONTRAST CT MAXILLOFACIAL WITHOUT CONTRAST CT CERVICAL SPINE WITHOUT CONTRAST  Technique:  Multidetector CT imaging of the head, cervical spine, and maxillofacial structures were performed using the standard protocol without intravenous contrast. Multiplanar CT image reconstructions of the cervical spine and maxillofacial structures were also generated.  Comparison:   Prior study from 06/26/2013  CT HEAD  Findings: Diffuse prominence of the CSF containing spaces is similar as compared to the prior study, consistent with atrophy. Scattered and confluent hypodensity within the periventricular white matter is consistent with chronic small vessel ischemic changes.  No acute intracranial hemorrhage or infarct.  No midline shift or mass lesion.  Contusion overlies the left frontal scalp.  Orbits are intact. Mastoid air cells are clear.  IMPRESSION: 1.  No CT evidence of acute intracranial process. 2.  Atrophy with chronic microvascular ischemic changes.  CT MAXILLOFACIAL  Findings:  Study is degraded by motion artifact.Left forehead contusion is noted.  The globes are intact. Paranasal sinuses are clear.  IMPRESSION:  Limited study due to motion.  Left forehead contusion without acute maxillofacial fracture.  Intact globes.  CT CERVICAL SPINE  Findings:   There is no acute fracture listhesis within the cervical spine. One anterolisthesis of C4 and C5.  Severe degenerative intervertebral disc space narrowing  is present at C5-6 and C6-7.  Vertebral body heights are preserved. The normal C1-2 articulations are intact.  IMPRESSION: No acute fracture or listhesis.   Original Report Authenticated By: Rise Mu, M.D.   Ct Cervical Spine Wo Contrast  07/11/2013   *RADIOLOGY REPORT*  Clinical Data:  Altered mental status, tripped and fell  CT HEAD WITHOUT CONTRAST CT MAXILLOFACIAL WITHOUT CONTRAST CT CERVICAL SPINE WITHOUT CONTRAST  Technique:  Multidetector CT imaging of the head, cervical spine, and maxillofacial structures were performed using the standard protocol without intravenous contrast. Multiplanar CT image reconstructions of the cervical spine and maxillofacial structures were also generated.  Comparison:   Prior study from 06/26/2013  CT HEAD  Findings: Diffuse prominence of the CSF  containing spaces is similar as compared to the prior study, consistent with atrophy. Scattered and confluent hypodensity within the periventricular white matter is consistent with chronic small vessel ischemic changes.  No acute intracranial hemorrhage or infarct.  No midline shift or mass lesion.  Contusion overlies the left frontal scalp.  Orbits are intact. Mastoid air cells are clear.  IMPRESSION: 1.  No CT evidence of acute intracranial process. 2.  Atrophy with chronic microvascular ischemic changes.  CT MAXILLOFACIAL  Findings:  Study is degraded by motion artifact.Left forehead contusion is noted.  The globes are intact. Paranasal sinuses are clear.  IMPRESSION:  Limited study due to motion.  Left forehead contusion without acute maxillofacial fracture.  Intact globes.  CT CERVICAL SPINE  Findings:   There is no acute fracture listhesis within the cervical spine. One anterolisthesis of C4 and C5.  Severe degenerative intervertebral disc space narrowing is present at C5-6 and C6-7.  Vertebral body heights are preserved. The normal C1-2 articulations are intact.  IMPRESSION: No acute fracture or listhesis.   Original  Report Authenticated By: Rise Mu, M.D.   Dg Chest Port 1 View  07/12/2013   *RADIOLOGY REPORT*  Clinical Data: Left femoral neck fracture, preoperative evaluation, shortness of breath, weakness, history hypertension, dementia, smoking  PORTABLE CHEST - 1 VIEW  Comparison: Portable exam 1720 hours compared to 07/11/2013  Findings: Normal heart size and pulmonary vascularity. Calcified tortuous aorta. Lungs appear emphysematous but clear. No pleural effusion or pneumothorax. Bones demineralized. Question calcified mediastinal lymph node.  IMPRESSION: Emphysematous changes. No acute abnormalities.   Original Report Authenticated By: Ulyses Southward, M.D.   Ct Maxillofacial Wo Cm  07/11/2013   *RADIOLOGY REPORT*  Clinical Data:  Altered mental status, tripped and fell  CT HEAD WITHOUT CONTRAST CT MAXILLOFACIAL WITHOUT CONTRAST CT CERVICAL SPINE WITHOUT CONTRAST  Technique:  Multidetector CT imaging of the head, cervical spine, and maxillofacial structures were performed using the standard protocol without intravenous contrast. Multiplanar CT image reconstructions of the cervical spine and maxillofacial structures were also generated.  Comparison:   Prior study from 06/26/2013  CT HEAD  Findings: Diffuse prominence of the CSF containing spaces is similar as compared to the prior study, consistent with atrophy. Scattered and confluent hypodensity within the periventricular white matter is consistent with chronic small vessel ischemic changes.  No acute intracranial hemorrhage or infarct.  No midline shift or mass lesion.  Contusion overlies the left frontal scalp.  Orbits are intact. Mastoid air cells are clear.  IMPRESSION: 1.  No CT evidence of acute intracranial process. 2.  Atrophy with chronic microvascular ischemic changes.  CT MAXILLOFACIAL  Findings:  Study is degraded by motion artifact.Left forehead contusion is noted.  The globes are intact. Paranasal sinuses are clear.  IMPRESSION:  Limited study  due to motion.  Left forehead contusion without acute maxillofacial fracture.  Intact globes.  CT CERVICAL SPINE  Findings:   There is no acute fracture listhesis within the cervical spine. One anterolisthesis of C4 and C5.  Severe degenerative intervertebral disc space narrowing is present at C5-6 and C6-7.  Vertebral body heights are preserved. The normal C1-2 articulations are intact.  IMPRESSION: No acute fracture or listhesis.   Original Report Authenticated By: Rise Mu, M.D.    Scheduled Meds: . [facility] HOLD] aspirin EC  81 mg Oral q morning - 10a  .  ceFAZolin (ANCEF) IV  2 g Intravenous On Call to OR  . [facility] HOLD] feeding supplement  237 mL Oral  BID BM  . [facility] HOLD] levothyroxine  50 mcg Oral QAC breakfast  . [MAR HOLD] multivitamin with minerals  1 tablet Oral q morning - 10a  . [facility] HOLD] senna  1 tablet Oral QHS   Continuous Infusions: . dextrose 5 % and 0.45% NaCl 75 mL/hr at 07/13/13 1610    Principal Problem:   Closed left hip fracture Active Problems:   HTN (hypertension)   Dementia   Hypothyroidism    Time spent: > 35 minutes    [name]  [facility] Pager [phone] If 7PM-7AM, please contact night-coverage at [email or web address], password [facility] 07/13/2013, 11:24 AM  LOS: 2 days

## 2013-07-14 ENCOUNTER — Inpatient Hospital Stay (HOSPITAL_COMMUNITY): Payer: Medicare Other

## 2013-07-14 DIAGNOSIS — N289 Disorder of kidney and ureter, unspecified: Secondary | ICD-10-CM

## 2013-07-14 DIAGNOSIS — I959 Hypotension, unspecified: Secondary | ICD-10-CM

## 2013-07-14 DIAGNOSIS — D62 Acute posthemorrhagic anemia: Secondary | ICD-10-CM | POA: Diagnosis not present

## 2013-07-14 LAB — BASIC METABOLIC PANEL
BUN: 19 mg/dL (ref 6–23)
Chloride: 103 mEq/L (ref 96–112)
Creatinine, Ser: 1.5 mg/dL — ABNORMAL HIGH (ref 0.50–1.10)
Glucose, Bld: 106 mg/dL — ABNORMAL HIGH (ref 70–99)
Potassium: 3.9 mEq/L (ref 3.5–5.1)

## 2013-07-14 LAB — CBC
HCT: 28.1 % — ABNORMAL LOW (ref 36.0–46.0)
Hemoglobin: 9.4 g/dL — ABNORMAL LOW (ref 12.0–15.0)
MCHC: 33.5 g/dL (ref 30.0–36.0)
MCV: 92.7 fL (ref 78.0–100.0)
WBC: 12.2 10*3/uL — ABNORMAL HIGH (ref 4.0–10.5)

## 2013-07-14 LAB — URINE MICROSCOPIC-ADD ON

## 2013-07-14 LAB — URINALYSIS, ROUTINE W REFLEX MICROSCOPIC
Bilirubin Urine: NEGATIVE
Ketones, ur: NEGATIVE mg/dL
Protein, ur: 30 mg/dL — AB
Urobilinogen, UA: 0.2 mg/dL (ref 0.0–1.0)

## 2013-07-14 MED ORDER — SODIUM CHLORIDE 0.9 % IV BOLUS (SEPSIS)
1000.0000 mL | Freq: Once | INTRAVENOUS | Status: AC
Start: 2013-07-14 — End: 2013-07-14
  Administered 2013-07-14: 1000 mL via INTRAVENOUS

## 2013-07-14 MED ORDER — VANCOMYCIN HCL 500 MG IV SOLR
500.0000 mg | INTRAVENOUS | Status: DC
  Administered 2013-07-14: 500 mg via INTRAVENOUS
  Filled 2013-07-14 (×2): qty 500

## 2013-07-14 MED ORDER — PIPERACILLIN-TAZOBACTAM 3.375 G IVPB
3.3750 g | Freq: Three times a day (TID) | INTRAVENOUS | Status: DC
  Administered 2013-07-14 – 2013-07-15 (×3): 3.375 g via INTRAVENOUS
  Filled 2013-07-14 (×4): qty 50

## 2013-07-14 MED ORDER — WARFARIN SODIUM 2.5 MG PO TABS
2.5000 mg | ORAL_TABLET | Freq: Once | ORAL | Status: AC
  Administered 2013-07-14: 2.5 mg via ORAL
  Filled 2013-07-14: qty 1

## 2013-07-14 NOTE — Progress Notes (Signed)
ANTIBIOTIC CONSULT NOTE - INITIAL  Pharmacy Consult for vancomycin/Zosyn Indication: rule out sepsis  Allergies  Allergen Reactions  . Aricept [Donepezil Hcl] Nausea And Vomiting    Patient Measurements: Height: 5\' 5"  (165.1 cm) Weight: 120 lb 6.4 oz (54.613 kg) IBW/kg (Calculated) : 57  Vital Signs: Temp: 100.1 F (37.8 C) (08/24 1330) Temp src: Axillary (08/24 1330) BP: 100/39 mmHg (08/24 1330) Pulse Rate: 105 (08/24 1330) Intake/Output from previous day: 08/23 0701 - 08/24 0700 In: 3062.1 [I.V.:3012.1; IV Piggyback:50] Out: 1275 [Urine:1175; Blood:100] Intake/Output from this shift: Total I/O In: 440 [P.O.:440] Out: 200 [Urine:200]  Labs:  Recent Labs  07/11/13 2335 07/13/13 0540 07/14/13 0436  WBC 11.2* 7.7 12.2*  HGB 11.3* 11.0* 9.4*  PLT 225 177 151  CREATININE 1.37* 1.24* 1.50*   Estimated Creatinine Clearance: 24.1 ml/min (by C-G formula based on Cr of 1.5). No results found for this basename: VANCOTROUGH, Leodis Binet, VANCORANDOM, GENTTROUGH, GENTPEAK, GENTRANDOM, TOBRATROUGH, TOBRAPEAK, TOBRARND, AMIKACINPEAK, AMIKACINTROU, AMIKACIN,  in the last 72 hours   Microbiology: Recent Results (from the past 720 hour(s))  SURGICAL PCR SCREEN     Status: None   Collection Time    07/12/13  9:07 AM      Result Value Range Status   MRSA, PCR NEGATIVE  NEGATIVE Final   Staphylococcus aureus NEGATIVE  NEGATIVE Final   Comment:            The Xpert SA Assay (FDA     approved for NASAL specimens     in patients over 28 years of age),     is one component of     a comprehensive surveillance     program.  Test performance has     been validated by [employer] for patients greater     than or equal to 72 year old.     It is not intended     to diagnose infection nor to     guide or monitor treatment.    Medical History: Past Medical History  Diagnosis Date  . Hypertension   . Hypothyroidism   . Mental disorder   . DEMENTIA   . Anxiety   . Chest  pain 2013  . Weakness   . Dysphagia   . Muscle/ligament disorder   . Speech disorder   . Renal disorder   . Fracture of right superior rim of pubis with routine healing     right superior and pubi rami fracture    Medications:  Scheduled:  . feeding supplement  237 mL Oral BID BM  . ferrous sulfate  325 mg Oral TID PC  . levothyroxine  50 mcg Oral QAC breakfast  . multivitamin with minerals  1 tablet Oral q morning - 10a  . senna  1 tablet Oral QHS  . sodium chloride  1,000 mL Intravenous Once  . warfarin  2.5 mg Oral ONCE-1800  . warfarin   Does not apply Once  . Warfarin - Pharmacist Dosing Inpatient   Does not apply q1800   Infusions:  . lactated ringers 100 mL/hr at 07/14/13 0705   Assessment: Patient known to pharmacy for warfarin dosing s/p THA. Now experiencing hypotension and fevers to start broad spectrum abx's vancomycin and Zosyn per pharmacy dosing  Tmax 101.2  Renal dysfunction - CrCl est 24 ml/min  WBC rising  Goal of Therapy:  Vancomycin trough level 15-20 mcg/ml  Plan:  1) Vancomycin 500mg  IV q24 2) Zosyn 3.375g IV q8 (extended  interval infusion)   Hessie Knows, PharmD, BCPS Pager [phone] 07/14/2013 3:15 PM

## 2013-07-14 NOTE — Progress Notes (Signed)
Clinical Social Work Department CLINICAL SOCIAL WORK PLACEMENT NOTE 07/14/2013  Patient:  [name], [name]  Account Number:  [id number] Admit date:  07/11/2013  Clinical Social Worker:  Doroteo Glassman  Date/time:  07/14/2013 09:33 AM  Clinical Social Work is seeking post-discharge placement for this patient at the following level of care:   SKILLED NURSING   (*CSW will update this form in Epic as items are completed)   07/14/2013  Patient/family provided with [facility] Department of Clinical Social Work's list of facilities offering this level of care within the geographic area requested by the patient (or if unable, by the patient's family).  07/14/2013  Patient/family informed of their freedom to choose among providers that offer the needed level of care, that participate in Medicare, Medicaid or managed care program needed by the patient, have an available bed and are willing to accept the patient.  07/14/2013  Patient/family informed of [facility]' ownership interest in [facility], as well as of the fact that they are under no obligation to receive care at this facility.  PASARR submitted to EDS on 04/27/2012 PASARR number received from [employer] on 04/27/2012  FL2 transmitted to all facilities in geographic area requested by pt/family on  07/14/2013 FL2 transmitted to all facilities within larger geographic area on   Patient informed that his/her managed care company has contracts with or will negotiate with  certain facilities, including the following:     Patient/family informed of bed offers received:   Patient chooses bed at  Physician recommends and patient chooses bed at    Patient to be transferred to  on   Patient to be transferred to facility by   The following physician request were entered in Epic:   Additional Comments:  Providence Crosby, Theresia Majors Clinical Social Work [phone]

## 2013-07-14 NOTE — Evaluation (Signed)
Physical Therapy Evaluation Patient Details Name: [name] MRN: [id number] DOB: [birth year] Today's Date: 07/14/2013 Time: 1250-1305 PT Time Calculation (min): 15 min  PT Assessment / Plan / Recommendation History of Present Illness  s/p L hip hemi 8/23. Hx of dementia-from memory care unit  Clinical Impression  On eval, pt required +2 assist for mobility-sat EOB 4-5 minutes. +2 for bed mobility. Attempted standing x 3 but pt unable. Recommend SNF for rehab.     PT Assessment  Patient needs continued PT services    Follow Up Recommendations  SNF    Does the patient have the potential to tolerate intense rehabilitation      Barriers to Discharge        Equipment Recommendations  None recommended by PT    Recommendations for Other Services OT consult   Frequency Min 3X/week    Precautions / Restrictions Precautions Precautions: Fall;Posterior Hip Precaution Comments: Pt unable to verbalize/adhere to hip precautions. Will require constant cueing.  Required Braces or Orthoses: Knee Immobilizer - Left Restrictions Weight Bearing Restrictions: Yes Other Position/Activity Restrictions: Both PWB-100% and WBAT entered into order sets.    Pertinent Vitals/Pain L hip with activity-grimacing and stated "Oh God."      Mobility  Bed Mobility Bed Mobility: Supine to Sit;Sit to Supine Supine to Sit: 1: +2 Total assist  Supine to Sit: Patient Percentage: 10% Sit to Supine: 1: +2 Total assist Sit to Supine: Patient Percentage: 0% Details for Bed Mobility Assistance: Assist for trunk to upright/supine and bil LEs off/onto bed. Multimodal cues for safety, technique, hand placement. Utililzed bedpad to assist with scooting, positioning.  Transfers Details for Transfer Assistance: Attempted x 3 pt unable to.  Ambulation/Gait Ambulation/Gait Assistance: Not tested (comment)    Exercises     PT Diagnosis: Difficulty walking;Acute pain;Altered mental status;Generalized weakness   PT Problem List: Decreased strength;Decreased range of motion;Decreased activity tolerance;Decreased balance;Decreased mobility;Decreased knowledge of precautions;Decreased cognition;Decreased knowledge of use of DME;Pain;Decreased safety awareness PT Treatment Interventions: DME instruction;Gait training;Functional mobility training;Therapeutic activities;Therapeutic exercise;Patient/family education;Neuromuscular re-education;Balance training     PT Goals(Current goals can be found in the care plan section) Acute Rehab PT Goals Patient Stated Goal: none stated PT Goal Formulation: Patient unable to participate in goal setting Time For Goal Achievement: 07/28/13 Potential to Achieve Goals: Fair  Visit Information  Last PT Received On: 07/14/13 Assistance Needed: +2 History of Present Illness: s/p L hip hemi 8/23. Hx of dementia-from memory care unit       Prior Functioning  Home Living Family/patient expects to be discharged to:: Other (Comment) (Memory unit) Additional Comments: Pt unable to provide any info.  Prior Function Comments: Pt uanble to proive any info Communication Communication: Expressive difficulties (very little verbalization from pt.)    Cognition  Cognition Arousal/Alertness: Lethargic Behavior During Therapy: Flat affect Overall Cognitive Status: Difficult to assess    Extremity/Trunk Assessment Upper Extremity Assessment Upper Extremity Assessment: Defer to OT evaluation Lower Extremity Assessment Lower Extremity Assessment: LLE deficits/detail;RLE deficits/detail RLE Deficits / Details: unable to assess due to cognition LLE Deficits / Details: unable to assess due to cognition LLE: Unable to fully assess due to immobilization   Balance Balance Balance Assessed: Yes Static Sitting Balance Static Sitting - Balance Support: Bilateral upper extremity supported;Feet supported Static Sitting - Level of Assistance: 4: Min assist Static Sitting -  Comment/# of Minutes: Sat EOB ~4-5 minutes. Pt attempting to adjust multiple times. Multimodal cues for safety, adherence to precautions  End of Session PT -  End of Session Activity Tolerance: Patient limited by pain Patient left: in bed;with call bell/phone within reach;with bed alarm set  GP     Rebeca Alert, MPT Pager: [phone]

## 2013-07-14 NOTE — Progress Notes (Signed)
Clinical Social Work Department BRIEF PSYCHOSOCIAL ASSESSMENT 07/14/2013  Patient:  [name], [name]     Account Number:  [id number]     Admit date:  07/11/2013  Clinical Social Worker:  Doroteo Glassman  Date/Time:  07/14/2013 09:28 AM  Referred by:  Physician  Date Referred:  07/14/2013 Referred for  SNF Placement   Other Referral:   Interview type:  Other - See comment Other interview type:   Pt's nephew, Mr. [name], via phone    PSYCHOSOCIAL DATA Living Status:  FACILITY Admitted from facility[facility]  [facility] Level of care:  Assisted Living Primary support name:  Mr. [name] Primary support relationship to patient:  FAMILY Degree of support available:   strong    CURRENT CONCERNS Current Concerns  Post-Acute Placement   Other Concerns:    SOCIAL WORK ASSESSMENT / PLAN Chart review.  Noted that Pt is from [facility] facility and that Pt's dementia precludes her from participating in CSW's assessment.    Spoke with Pt's nephew, Mr. [name], via phone.  Discussed d/c plans.  Mr. [name] stated that Pt was at [facility] last year and that they'd like for her to return to that facility.  He expained that his mother-in-law resides there and that it would be convenient for the family to have Pt there, as well.    Mr. [name] gave CSW permission to send Pt's information to all [facility] SNFs, as he understands that there's the possibility that [facility] isn't available.    CSW to leave SNF list in Pt's room    CSW thanked Mr. [name] for his time.   Assessment/plan status:  Psychosocial Support/Ongoing Assessment of Needs Other assessment/ plan:   Information/referral to community resources:   SNF list    PATIENT'S/FAMILY'S RESPONSE TO PLAN OF CARE: Mr. [name] was agreeable to SNF for his aunt and was hopeful that Pt can go to [facility].    Mr. [name] thanked CSW for time and assistance.   Providence Crosby, LCSWA Clinical  Social Work [phone]

## 2013-07-14 NOTE — Progress Notes (Signed)
[facility] PROGRESS NOTE  [name] NWG:[id number] DOB: [birth year] DOA: 07/11/2013 PCP: Florentina Jenny, MD  Assessment/Plan:  1. Closed left hip fracture - - Per Ortho at this point. Will f/u with their recommendations.  2. Hypotension - Patient had fever documented within the last 24 hours - Agree with fluid bolus - blood culture, urine culture, chest x ray portable to look for sources of infection. - avoid opiods while hypotensive.  3. SIRs criteria - Given positive sirs criteria, hypotension, and leukocytosis; I will place on broad spectrum antibiotics at this juncture. Discussed with nursing and broad spectrum antibiotics to be administered after blood culture obtained - Lactic acid levels - Vancomycin and Zosyn  4. Dementia - chronic and appears to be baseline.  5. Hypothyroidism:  - check tsh levels next am.  Code Status: full Family Communication: No family at bedside Disposition Plan: Pending plans by surgical team.   Consultants:  Ortho  Procedures: 1 Day Post-Op Procedure(s) (LRB):  ARTHROPLASTY BIPOLAR HIP (Left)  Antibiotics:  Vanc and Zosyn started 8/24  HPI/Subjective: No new complaints. Patient reportedly had fever in the last 24 hours as well as 1 bout of hypotension.  Patient has no new complaints currently.  Objective: Filed Vitals:   07/14/13 1330  BP: 100/39  Pulse: 105  Temp: 100.1 F (37.8 C)  Resp: 18    Intake/Output Summary (Last 24 hours) at 07/14/13 1502 Last data filed at 07/14/13 1206  Gross per 24 hour  Intake 2058.34 ml  Output    450 ml  Net 1608.34 ml   Filed Weights   07/12/13 0116 07/12/13 1433  Weight: 56.6 kg (124 lb 12.5 oz) 54.613 kg (120 lb 6.4 oz)    Exam:   General:  Pt in NAD, alert and Awake  Cardiovascular: RRR, no MRG  Respiratory: CTA BL, no wheezes  Abdomen: soft, NT, ND  Musculoskeletal: Pain at left hip  Data Reviewed: Basic Metabolic Panel:  Recent Labs Lab 07/11/13 2335  07/13/13 0540 07/14/13 0436  NA 136 136 135  K 3.9 3.4* 3.9  CL 100 103 103  CO2 25 25 26   GLUCOSE 115* 106* 106*  BUN 33* 18 19  CREATININE 1.37* 1.24* 1.50*  CALCIUM 9.2 8.6 8.4   Liver Function Tests:  Recent Labs Lab 07/11/13 2335 07/13/13 0540  AST 23 20  ALT 18 14  ALKPHOS 88 74  BILITOT 0.3 0.6  PROT 6.7 5.9*  ALBUMIN 3.4* 2.8*   No results found for this basename: LIPASE, AMYLASE,  in the last 168 hours No results found for this basename: AMMONIA,  in the last 168 hours CBC:  Recent Labs Lab 07/11/13 2335 07/13/13 0540 07/14/13 0436  WBC 11.2* 7.7 12.2*  NEUTROABS 8.6* 4.4  --   HGB 11.3* 11.0* 9.4*  HCT 33.0* 31.1* 28.1*  MCV 91.4 90.4 92.7  PLT 225 177 151   Cardiac Enzymes: No results found for this basename: CKTOTAL, CKMB, CKMBINDEX, TROPONINI,  in the last 168 hours BNP (last 3 results) No results found for this basename: PROBNP,  in the last 8760 hours CBG: No results found for this basename: GLUCAP,  in the last 168 hours  Recent Results (from the past 240 hour(s))  SURGICAL PCR SCREEN     Status: None   Collection Time    07/12/13  9:07 AM      Result Value Range Status   MRSA, PCR NEGATIVE  NEGATIVE Final   Staphylococcus aureus NEGATIVE  NEGATIVE Final  Comment:            The Xpert SA Assay (FDA     approved for NASAL specimens     in patients over 61 years of age),     is one component of     a comprehensive surveillance     program.  Test performance has     been validated by [employer] for patients greater     than or equal to 80 year old.     It is not intended     to diagnose infection nor to     guide or monitor treatment.     Studies: Dg Pelvis Portable  07/13/2013   *RADIOLOGY REPORT*  Clinical Data: Postop left total hip replacement  PORTABLE PELVIS  Comparison: 06/26/2013  Findings: A new left hip prosthesis is well seated and aligned. There is some soft tissue air adjacent to the left hip consistent with the  expected postoperative change.  Old fractures of the right superior and inferior pubic rami are noted, stable.  The bones are diffusely demineralized.  IMPRESSION: Left hip prosthesis is well seated and aligned.  No evidence of an operative complication.   Original Report Authenticated By: Amie Portland, M.D.   Dg Chest Port 1 View  07/14/2013   *RADIOLOGY REPORT*  Clinical Data: Leukocytosis.  Fever.  PORTABLE CHEST - 1 VIEW  Comparison: 07/12/2013  Findings: Coarse reticular opacities in the upper lobes and apices and left lung base are stable consistent with scarring.  No acute findings in the lungs.  Specifically, no infiltrate.  No pleural effusion or pneumothorax is seen.  Cardiac silhouette is normal in size.  No mediastinal or hilar masses are noted.  IMPRESSION: No acute findings.  No change from the recent prior study.   Original Report Authenticated By: Amie Portland, M.D.   Dg Chest Port 1 View  07/12/2013   *RADIOLOGY REPORT*  Clinical Data: Left femoral neck fracture, preoperative evaluation, shortness of breath, weakness, history hypertension, dementia, smoking  PORTABLE CHEST - 1 VIEW  Comparison: Portable exam 1720 hours compared to 07/11/2013  Findings: Normal heart size and pulmonary vascularity. Calcified tortuous aorta. Lungs appear emphysematous but clear. No pleural effusion or pneumothorax. Bones demineralized. Question calcified mediastinal lymph node.  IMPRESSION: Emphysematous changes. No acute abnormalities.   Original Report Authenticated By: Ulyses Southward, M.D.    Scheduled Meds: . feeding supplement  237 mL Oral BID BM  . ferrous sulfate  325 mg Oral TID PC  . levothyroxine  50 mcg Oral QAC breakfast  . multivitamin with minerals  1 tablet Oral q morning - 10a  . senna  1 tablet Oral QHS  . sodium chloride  1,000 mL Intravenous Once  . warfarin  2.5 mg Oral ONCE-1800  . warfarin   Does not apply Once  . Warfarin - Pharmacist Dosing Inpatient   Does not apply q1800    Continuous Infusions: . lactated ringers 100 mL/hr at 07/14/13 0705    Principal Problem:   Closed left hip fracture Active Problems:   HTN (hypertension)   Dementia   Hypothyroidism    Time spent: > 35 minutes    [name]  [facility] Pager [phone] If 7PM-7AM, please contact night-coverage at [email or web address], password [facility] 07/14/2013, 3:02 PM  LOS: 3 days

## 2013-07-14 NOTE — Progress Notes (Signed)
Subjective: 1 Day Post-Op Procedure(s) (LRB): ARTHROPLASTY BIPOLAR HIP (Left) Patient reports pain as mild.  No n/v/f/c.  Objective: Vital signs in last 24 hours: Temp:  [97.7 F (36.5 C)-102.5 F (39.2 C)] 100.1 F (37.8 C) (08/24 1330) Pulse Rate:  [73-105] 105 (08/24 1330) Resp:  [16-20] 18 (08/24 1330) BP: (93-148)/(39-78) 100/39 mmHg (08/24 1330) SpO2:  [95 %-100 %] 97 % (08/24 1330)  Intake/Output from previous day: 08/23 0701 - 08/24 0700 In: 3062.1 [I.V.:3012.1; IV Piggyback:50] Out: 1275 [Urine:1175; Blood:100] Intake/Output this shift: Total I/O In: 440 [P.O.:440] Out: -    Recent Labs  07/11/13 2335 07/13/13 0540 07/14/13 0436  HGB 11.3* 11.0* 9.4*    Recent Labs  07/13/13 0540 07/14/13 0436  WBC 7.7 12.2*  RBC 3.44* 3.03*  HCT 31.1* 28.1*  PLT 177 151    Recent Labs  07/13/13 0540 07/14/13 0436  NA 136 135  K 3.4* 3.9  CL 103 103  CO2 25 26  BUN 18 19  CREATININE 1.24* 1.50*  GLUCOSE 106* 106*  CALCIUM 8.6 8.4    Recent Labs  07/13/13 0540 07/14/13 0436  INR 1.08 1.15    PE:  elderly female in nad.  L hip wound drssed and dry.  NVI at LLE.  Assessment/Plan: 1 Day Post-Op Procedure(s) (LRB): ARTHROPLASTY BIPOLAR HIP (Left) Given hypotension, tachycardia and low UOP, I'll bolus 1000 cc NS.  Observe UOP afterwards.  Continue PT.  Leave foley for monitoring UOP.  Toni Arthurs 07/14/2013, 1:54 PM

## 2013-07-14 NOTE — Progress Notes (Signed)
ANTICOAGULATION CONSULT NOTE - Follow Up  Pharmacy Consult for warfarin Indication: VTE prophylaxis  Allergies  Allergen Reactions  . Aricept [Donepezil Hcl] Nausea And Vomiting    Patient Measurements: Height: 5\' 5"  (165.1 cm) Weight: 120 lb 6.4 oz (54.613 kg) IBW/kg (Calculated) : 57  Vital Signs: Temp: 100.4 F (38 C) (08/24 7829) Temp src: Axillary (08/24 5621) BP: 100/67 mmHg (08/24 0540) Pulse Rate: 80 (08/24 0540)  Labs:  Recent Labs  07/11/13 2335 07/13/13 0540 07/14/13 0436  HGB 11.3* 11.0* 9.4*  HCT 33.0* 31.1* 28.1*  PLT 225 177 151  APTT  --  28  --   LABPROT 12.0 13.8 14.5  INR 0.90 1.08 1.15  CREATININE 1.37* 1.24* 1.50*    Estimated Creatinine Clearance: 24.1 ml/min (by C-G formula based on Cr of 1.5).   Medical History: Past Medical History  Diagnosis Date  . Hypertension   . Hypothyroidism   . Mental disorder   . DEMENTIA   . Anxiety   . Chest pain 2013  . Weakness   . Dysphagia   . Muscle/ligament disorder   . Speech disorder   . Renal disorder   . Fracture of right superior rim of pubis with routine healing     right superior and pubi rami fracture    Medications:  Scheduled:  . feeding supplement  237 mL Oral BID BM  . ferrous sulfate  325 mg Oral TID PC  . levothyroxine  50 mcg Oral QAC breakfast  . multivitamin with minerals  1 tablet Oral q morning - 10a  . senna  1 tablet Oral QHS  . warfarin   Does not apply Once  . Warfarin - Pharmacist Dosing Inpatient   Does not apply q1800   Infusions:  . lactated ringers 100 mL/hr at 07/14/13 0705    Assessment: 79 yo from SNF presented to ER after witnessed fall found to have a left femoral neck fracture. Patient is now s/p L arthroplasty bipolar hip for this fracture performed today 8/23. To begin warfarin for DVT ppx post op 8/23 PM  INR subtherapeutic as expected after 1 dose of 2.5mg   No reported bleeding  Hgb dropped - monitor  Goal of Therapy:  INR 2-3   Plan:   1) Repeat warfarin 2.5mg  x 1 tonight 2) Daily INR   Hessie Knows, PharmD, BCPS Pager [phone] 07/14/2013 7:19 AM

## 2013-07-15 ENCOUNTER — Encounter (HOSPITAL_COMMUNITY): Payer: Self-pay | Admitting: Orthopedic Surgery

## 2013-07-15 DIAGNOSIS — D62 Acute posthemorrhagic anemia: Secondary | ICD-10-CM

## 2013-07-15 LAB — BASIC METABOLIC PANEL
BUN: 21 mg/dL (ref 6–23)
Chloride: 104 mEq/L (ref 96–112)
GFR calc Af Amer: 41 mL/min — ABNORMAL LOW (ref >90)
GFR calc non Af Amer: 35 mL/min — ABNORMAL LOW (ref >90)
Potassium: 3.7 mEq/L (ref 3.5–5.1)
Sodium: 136 mEq/L (ref 135–145)

## 2013-07-15 LAB — CBC
HCT: 24.1 % — ABNORMAL LOW (ref 36.0–46.0)
MCHC: 34.4 g/dL (ref 30.0–36.0)
Platelets: 156 10*3/uL (ref 150–400)
RDW: 15.5 % (ref 11.5–15.5)
WBC: 11.2 10*3/uL — ABNORMAL HIGH (ref 4.0–10.5)

## 2013-07-15 LAB — PROTIME-INR: Prothrombin Time: 15.7 seconds — ABNORMAL HIGH (ref 11.6–15.2)

## 2013-07-15 LAB — TSH: TSH: 1.571 u[IU]/mL (ref 0.350–4.500)

## 2013-07-15 MED ORDER — LEVOFLOXACIN 500 MG PO TABS
500.0000 mg | ORAL_TABLET | Freq: Once | ORAL | Status: AC
  Administered 2013-07-15: 500 mg via ORAL
  Filled 2013-07-15: qty 1

## 2013-07-15 MED ORDER — SODIUM CHLORIDE 0.9 % IV SOLN
INTRAVENOUS | Status: DC
  Administered 2013-07-15 – 2013-07-16 (×2): via INTRAVENOUS

## 2013-07-15 MED ORDER — SODIUM CHLORIDE 0.9 % IV BOLUS (SEPSIS)
500.0000 mL | Freq: Once | INTRAVENOUS | Status: AC
  Administered 2013-07-15: 500 mL via INTRAVENOUS

## 2013-07-15 MED ORDER — LEVOFLOXACIN 250 MG PO TABS
250.0000 mg | ORAL_TABLET | Freq: Every day | ORAL | Status: DC
  Administered 2013-07-16: 250 mg via ORAL
  Filled 2013-07-15: qty 1

## 2013-07-15 MED ORDER — HYDROCODONE-ACETAMINOPHEN 5-325 MG PO TABS: 1.0000 | ORAL_TABLET | Freq: Four times a day (QID) | ORAL | Status: AC | PRN

## 2013-07-15 MED ORDER — WARFARIN SODIUM 2.5 MG PO TABS
2.5000 mg | ORAL_TABLET | Freq: Once | ORAL | Status: AC
  Administered 2013-07-15: 2.5 mg via ORAL
  Filled 2013-07-15: qty 1

## 2013-07-15 NOTE — Progress Notes (Addendum)
ANTICOAGULATION CONSULT NOTE - Follow Up  Pharmacy Consult for warfarin Indication: VTE prophylaxis  Allergies  Allergen Reactions  . Aricept [Donepezil Hcl] Nausea And Vomiting    Patient Measurements: Height: 5\' 5"  (165.1 cm) Weight: 120 lb 6.4 oz (54.613 kg) IBW/kg (Calculated) : 57  Vital Signs: Temp: 99.6 F (37.6 C) (08/25 0429) Temp src: Axillary (08/25 0429) BP: 124/56 mmHg (08/25 0429) Pulse Rate: 98 (08/25 0429)  Labs:  Recent Labs  07/13/13 0540 07/14/13 0436 07/15/13 0415  HGB 11.0* 9.4* 8.3*  HCT 31.1* 28.1* 24.1*  PLT 177 151 156  APTT 28  --   --   LABPROT 13.8 14.5 15.7*  INR 1.08 1.15 1.28  CREATININE 1.24* 1.50* 1.34*    Estimated Creatinine Clearance: 26.9 ml/min (by C-G formula based on Cr of 1.34).   Medical History: Past Medical History  Diagnosis Date  . Hypertension   . Hypothyroidism   . Mental disorder   . DEMENTIA   . Anxiety   . Chest pain 2013  . Weakness   . Dysphagia   . Muscle/ligament disorder   . Speech disorder   . Renal disorder   . Fracture of right superior rim of pubis with routine healing     right superior and pubi rami fracture    Medications:  Scheduled:  . feeding supplement  237 mL Oral BID BM  . ferrous sulfate  325 mg Oral TID PC  . levothyroxine  50 mcg Oral QAC breakfast  . multivitamin with minerals  1 tablet Oral q morning - 10a  . piperacillin-tazobactam (ZOSYN)  IV  3.375 g Intravenous Q8H  . senna  1 tablet Oral QHS  . vancomycin  500 mg Intravenous Q24H  . warfarin   Does not apply Once  . Warfarin - Pharmacist Dosing Inpatient   Does not apply q1800   Infusions:  . lactated ringers 100 mL/hr at 07/14/13 2354   Inpatient warfarin doses: 2.5mg  (8/23) and 2.5mg  (8/24)  Assessment: 79 yo from SNF presented to ER after witnessed fall found to have a left femoral neck fracture. Patient is now s/p L arthroplasty bipolar hip for this fracture performed 8/23. Warfarin initiated for DVT ppx post  op 8/23 PM  INR subtherapeutic but responding as expected after 2 doses of 2.5mg   No reported bleeding  Hgb dropped - monitor  Attempted to educate patient regarding warfarin therapy, but she is unable to comprehend due to dementia. No family members present to educate today.  Goal of Therapy:  INR 2-3   Plan:  1) Repeat warfarin 2.5mg  x 1 tonight 2) Recommend daily INR at SNF until therapeutic   Loralee Pacas, PharmD, BCPS Pager: [phone]  07/15/2013 8:20 AM

## 2013-07-15 NOTE — Progress Notes (Signed)
Subjective: 2 Days Post-Op Procedure(s) (LRB): ARTHROPLASTY BIPOLAR HIP (Left) Patient reports pain as 2 on 0-10 scale. Will have Hospitalist DC to SNF today. We need to see her in Office in two weeks.   Objective: Vital signs in last 24 hours: Temp:  [98.9 F (37.2 C)-100.1 F (37.8 C)] 99.6 F (37.6 C) (08/25 0429) Pulse Rate:  [92-105] 98 (08/25 0429) Resp:  [16-20] 18 (08/25 0429) BP: (94-124)/(39-59) 124/56 mmHg (08/25 0429) SpO2:  [95 %-97 %] 96 % (08/25 0429)  Intake/Output from previous day: 08/24 0701 - 08/25 0700 In: 2630 [P.O.:680; I.V.:800; IV Piggyback:1150] Out: 800 [Urine:800] Intake/Output this shift: Total I/O In: 120 [P.O.:120] Out: 600 [Urine:600]   Recent Labs  07/13/13 0540 07/14/13 0436 07/15/13 0415  HGB 11.0* 9.4* 8.3*    Recent Labs  07/14/13 0436 07/15/13 0415  WBC 12.2* 11.2*  RBC 3.03* 2.62*  HCT 28.1* 24.1*  PLT 151 156    Recent Labs  07/14/13 0436 07/15/13 0415  NA 135 136  K 3.9 3.7  CL 103 104  CO2 26 26  BUN 19 21  CREATININE 1.50* 1.34*  GLUCOSE 106* 106*  CALCIUM 8.4 8.2*    Recent Labs  07/14/13 0436 07/15/13 0415  INR 1.15 1.28    Dorsiflexion/Plantar flexion intact  Assessment/Plan: 2 Days Post-Op Procedure(s) (LRB): ARTHROPLASTY BIPOLAR HIP (Left) Discharge to SNF.See Dr.Areil Ottey in office in Two weeks. Keep brace Padded to prevent pressure on back of her leg. Pillow between her thighs when on her side.  Temeka Pore A 07/15/2013, 6:57 AM

## 2013-07-15 NOTE — Clinical Documentation Improvement (Signed)
THIS DOCUMENT IS NOT A PERMANENT PART OF THE MEDICAL RECORD  Please update your documentation with the medical record to reflect your response to this query. If you need help knowing how to do this please call [phone].  07/15/13  Dear Dr. Cena Benton, [facility] [place],  In a better effort to capture your patient's severity of illness, reflect appropriate length of stay and utilization of resources, a review of the patient medical record has revealed the following indicators.    Based on your clinical judgment, please clarify and document in a progress note and/or discharge summary the clinical condition associated with the following supporting information:  In responding to this query please exercise your independent judgment.  The fact that a query is asked, does not imply that any particular answer is desired or expected.    Pt meets SIR criteria per pn 07/14/13  Clarification Needed   Please clarify if SIRS in setting of the following Sepsis markers listed below can be further specified as one of the diagnoses listed below and document in pn or d/c summary.  Sepsis Markers:  WBCs=11.2 / 12.2   Neutrophils= 8.6  Temps=102.5 / 102.5 / 100.4  B/P= 93/ 60 ; 94/50  Pulse Rate= 105 / 100  Tachycardia     Possible Clinical Conditions?  Septicemia / Sepsis Severe Sepsis Neutropenic Sepsis  SIRS Septic Shock Sepsis with UTI Sepsis due to an internal device ( ) Bacterial infection of unknown etiology / source Other Condition __________________ Cannot clinically Determine _______________  Risk Factors:  Presenting Signs and Symptoms: PN 07/14/13 SIRs criteria - Given positive sirs criteria, hypotension, and leukocytosis; I will place on broad spectrum antibiotics at this juncture.  PN 07/14/13 Given hypotension, tachycardia and low UOP, I'll bolus 1000 cc NS. Observe UOP afterwards.    Cultures:  Treatment: Vancomycin  Reviewed:  no additional documentation  provided  Thank You,  Enis Slipper  RN, BSN, MSN/Inf, CCDS Clinical Documentation Specialist [facility] Dept Pager: [phone] / E-mail: [email or web address]  [phone] Health Information Management [place]  Already characterized Sirs on chart.

## 2013-07-15 NOTE — Progress Notes (Signed)
OT Cancellation Note  Patient Details Name: [name] MRN: [id number] DOB: 1927-11-04   Cancelled Treatment:    Reason Eval/Treat Not Completed: OT screened, no needs identified, will sign off  Pt with severe dementia and total A.  OT not appropriate for this pt. Will sign off  Jahmere Bramel, [place] 07/15/2013, 1:13 PM

## 2013-07-15 NOTE — Progress Notes (Signed)
Physical Therapy Treatment Patient Details Name: [name] MRN: [id number] DOB: [birth year] Today's Date: 07/15/2013 Time: 0950-1005 PT Time Calculation (min): 15 min  PT Assessment / Plan / Recommendation  History of Present Illness s/p L hip hemi 8/23. Hx of dementia-from memory care unit   PT Comments   Pt awake, grossly non-verbal, except yes/no  Follow Up Recommendations  SNF     Does the patient have the potential to tolerate intense rehabilitation     Barriers to Discharge        Equipment Recommendations  None recommended by PT    Recommendations for Other Services    Frequency Min 3X/week   Progress towards PT Goals Progress towards PT goals: Progressing toward goals  Plan Current plan remains appropriate    Precautions / Restrictions Precautions Precautions: Fall;Posterior Hip Precaution Comments: Pt unable to verbalize/adhere to hip precautions. Will require constant cueing.  Required Braces or Orthoses: Knee Immobilizer - Left Restrictions Weight Bearing Restrictions: Yes Other Position/Activity Restrictions: Both PWB-100% and WBAT entered into order sets.    Pertinent Vitals/Pain C/o pain left hip, RN aware    Mobility  Bed Mobility Bed Mobility: Supine to Sit;Sit to Supine Supine to Sit: 1: +2 Total assist Supine to Sit: Patient Percentage: 30% Sit to Supine: 1: +2 Total assist Sit to Supine: Patient Percentage: 10% Details for Bed Mobility Assistance: Assist for trunk to upright/supine and bil LEs off/onto bed. Multimodal cues for safety, technique, hand placement. Utililzed bedpad to assist with scooting, positioning.  Transfers Transfers:  (attempted, pt unable to bear wt on LEs) Details for Transfer Assistance: attempted x2; pt unable Ambulation/Gait Ambulation/Gait Assistance: Not tested (comment)    Exercises     PT Diagnosis:    PT Problem List:   PT Treatment Interventions:     PT Goals (current goals can now be found in the care plan  section) Acute Rehab PT Goals Patient Stated Goal: none stated PT Goal Formulation: Patient unable to participate in goal setting Time For Goal Achievement: 07/28/13 Potential to Achieve Goals: Fair  Visit Information  Last PT Received On: 07/15/13 Assistance Needed: +2 History of Present Illness: s/p L hip hemi 8/23. Hx of dementia-from memory care unit    Subjective Data  Patient Stated Goal: none stated   Cognition  Cognition Arousal/Alertness: Awake/alert Behavior During Therapy: Flat affect Overall Cognitive Status: History of cognitive impairments - at baseline    Balance  Static Sitting Balance Static Sitting - Balance Support: Right upper extremity supported;Bilateral upper extremity supported;Feet supported Static Sitting - Level of Assistance: 5: Stand by assistance Static Sitting - Comment/# of Minutes: sat EOB 4-65min; SBA for safety and adherence to hip precautions  End of Session PT - End of Session Activity Tolerance: Patient limited by pain Patient left: in bed;with call bell/phone within reach;with nursing/sitter in room Nurse Communication: Mobility status   GP     [facility] 07/15/2013, 10:10 AM

## 2013-07-15 NOTE — Progress Notes (Signed)
PHARMACY CONSULT: Levaquin  MD changing Vancomycin and Zosyn to PO Levaquin with Pharmacy to dose.  8/24 >> vancomycin >> 8/25 8/24 >> zosyn >> 8/25 8/25 >> levaquin >>  Tmax: 100.1 WBCs: elevated 11.2k Renal: Scr 1.34, rising. CrCl 27 CG  8/24 blood: NGtd 8/24 urine: sent   Plan:   Levaquin 500mg  PO x 1 today, then 250mg  PO daily  Monitor closely for interaction with warfarin - can potentiate warfarin effects leading to increased INR.  Loralee Pacas, PharmD, BCPS 07/15/2013 11:05 AM Pager: [phone]

## 2013-07-15 NOTE — Progress Notes (Signed)
[facility] PROGRESS NOTE  [name]:[id number] DOB: October 17, 1927 DOA: 07/11/2013 PCP: Florentina Jenny, MD  Assessment/Plan:  1. Closed left hip fracture - - Clearted by Ortho for d/c.  Once medically ready will transition to SNF.  2. Hypotension - Patient had fever which has resolved. But has had repeat soft blood pressures.  - Agree with fluid bolus given map of 59. - blood cx/urine cx pending - chest x ray negative - avoid opiods while hypotensive.  3. SIRs criteria - Was placed on broad spectrum antibiotics yesterday 07/14/13.  U/A suspicious for UTI. Will place on levaquin until urine culture back. - Lactic acid levels WNL and reassuring - Vancomycin and Zosyn  4. Dementia - chronic and appears to be baseline.  5. Hypothyroidism:  - check tsh levels next am.  6. UTI - Continue with levaquin at this moment. - taylor abx regimen pending U/C results.  Code Status: full Family Communication: No family at bedside Disposition Plan: Pending plans by surgical team.   Consultants:  Ortho  Procedures: 1 Day Post-Op Procedure(s) (LRB):  ARTHROPLASTY BIPOLAR HIP (Left)  Antibiotics:  Vanc and Zosyn started 8/24  HPI/Subjective: No new complaints. Patient has advanced dementia and has limited interaction with examiner.  Objective: Filed Vitals:   07/15/13 1146  BP: 98/40  Pulse:   Temp:   Resp:     Intake/Output Summary (Last 24 hours) at 07/15/13 1318 Last data filed at 07/15/13 1115  Gross per 24 hour  Intake   2980 ml  Output   1200 ml  Net   1780 ml   Filed Weights   07/12/13 0116 07/12/13 1433  Weight: 56.6 kg (124 lb 12.5 oz) 54.613 kg (120 lb 6.4 oz)    Exam:   General:  Pt in NAD, alert and Awake  Cardiovascular: RRR, no MRG  Respiratory: CTA BL, no wheezes  Abdomen: soft, NT, ND  Musculoskeletal: Pain at left hip  Data Reviewed: Basic Metabolic Panel:  Recent Labs Lab 07/11/13 2335 07/13/13 0540 07/14/13 0436  07/15/13 0415  NA 136 136 135 136  K 3.9 3.4* 3.9 3.7  CL 100 103 103 104  CO2 25 25 26 26   GLUCOSE 115* 106* 106* 106*  BUN 33* 18 19 21   CREATININE 1.37* 1.24* 1.50* 1.34*  CALCIUM 9.2 8.6 8.4 8.2*   Liver Function Tests:  Recent Labs Lab 07/11/13 2335 07/13/13 0540  AST 23 20  ALT 18 14  ALKPHOS 88 74  BILITOT 0.3 0.6  PROT 6.7 5.9*  ALBUMIN 3.4* 2.8*   No results found for this basename: LIPASE, AMYLASE,  in the last 168 hours No results found for this basename: AMMONIA,  in the last 168 hours CBC:  Recent Labs Lab 07/11/13 2335 07/13/13 0540 07/14/13 0436 07/15/13 0415  WBC 11.2* 7.7 12.2* 11.2*  NEUTROABS 8.6* 4.4  --   --   HGB 11.3* 11.0* 9.4* 8.3*  HCT 33.0* 31.1* 28.1* 24.1*  MCV 91.4 90.4 92.7 92.0  PLT 225 177 151 156   Cardiac Enzymes: No results found for this basename: CKTOTAL, CKMB, CKMBINDEX, TROPONINI,  in the last 168 hours BNP (last 3 results) No results found for this basename: PROBNP,  in the last 8760 hours CBG: No results found for this basename: GLUCAP,  in the last 168 hours  Recent Results (from the past 240 hour(s))  SURGICAL PCR SCREEN     Status: None   Collection Time    07/12/13  9:07 AM  Result Value Range Status   MRSA, PCR NEGATIVE  NEGATIVE Final   Staphylococcus aureus NEGATIVE  NEGATIVE Final   Comment:            The Xpert SA Assay (FDA     approved for NASAL specimens     in patients over 32 years of age),     is one component of     a comprehensive surveillance     program.  Test performance has     been validated by [employer] for patients greater     than or equal to 47 year old.     It is not intended     to diagnose infection nor to     guide or monitor treatment.  CULTURE, BLOOD (ROUTINE X 2)     Status: None   Collection Time    07/14/13  2:45 PM      Result Value Range Status   Specimen Description BLOOD RIGHT ARM   Final   Special Requests BOTTLES DRAWN AEROBIC AND ANAEROBIC 10 CC EA    Final   Culture  Setup Time     Final   Value: 07/14/2013 19:51     Performed at [facility]   Culture     Final   Value:        BLOOD CULTURE RECEIVED NO GROWTH TO DATE CULTURE WILL BE HELD FOR 5 DAYS BEFORE ISSUING A FINAL NEGATIVE REPORT     Performed at [facility]   Report Status PENDING   Incomplete  CULTURE, BLOOD (ROUTINE X 2)     Status: None   Collection Time    07/14/13  2:55 PM      Result Value Range Status   Specimen Description BLOOD RIGHT ARM   Final   Special Requests BOTTLES DRAWN AEROBIC AND ANAEROBIC 10 CC EA   Final   Culture  Setup Time     Final   Value: 07/14/2013 19:51     Performed at [facility]   Culture     Final   Value:        BLOOD CULTURE RECEIVED NO GROWTH TO DATE CULTURE WILL BE HELD FOR 5 DAYS BEFORE ISSUING A FINAL NEGATIVE REPORT     Performed at [facility]   Report Status PENDING   Incomplete     Studies: Dg Chest Port 1 View  07/14/2013   *RADIOLOGY REPORT*  Clinical Data: Leukocytosis.  Fever.  PORTABLE CHEST - 1 VIEW  Comparison: 07/12/2013  Findings: Coarse reticular opacities in the upper lobes and apices and left lung base are stable consistent with scarring.  No acute findings in the lungs.  Specifically, no infiltrate.  No pleural effusion or pneumothorax is seen.  Cardiac silhouette is normal in size.  No mediastinal or hilar masses are noted.  IMPRESSION: No acute findings.  No change from the recent prior study.   Original Report Authenticated By: Amie Portland, M.D.    Scheduled Meds: . feeding supplement  237 mL Oral BID BM  . ferrous sulfate  325 mg Oral TID PC  . [START ON 07/16/2013] levofloxacin  250 mg Oral Daily  . levofloxacin  500 mg Oral Once  . levothyroxine  50 mcg Oral QAC breakfast  . multivitamin with minerals  1 tablet Oral q morning - 10a  . senna  1 tablet Oral QHS  . sodium chloride  500 mL Intravenous Once  .  warfarin   Does not apply Once  . Warfarin - Pharmacist Dosing  Inpatient   Does not apply q1800   Continuous Infusions: . sodium chloride    . lactated ringers 100 mL/hr at 07/14/13 2354    Principal Problem:   Closed left hip fracture Active Problems:   HTN (hypertension)   Dementia   Hypothyroidism   Hypotension, unspecified   Acute blood loss anemia    Time spent: > 35 minutes    [name]  [facility] Pager [phone] If 7PM-7AM, please contact night-coverage at [email or web address], password [facility] 07/15/2013, 1:18 PM  LOS: 4 days

## 2013-07-16 LAB — CBC
HCT: 22.7 % — ABNORMAL LOW (ref 36.0–46.0)
MCHC: 34.4 g/dL (ref 30.0–36.0)
Platelets: 197 10*3/uL (ref 150–400)
RDW: 15.4 % (ref 11.5–15.5)

## 2013-07-16 LAB — URINE CULTURE: Colony Count: NO GROWTH

## 2013-07-16 LAB — PROTIME-INR: INR: 1.2 (ref 0.00–1.49)

## 2013-07-16 MED ORDER — LEVOFLOXACIN 250 MG PO TABS: 250.0000 mg | ORAL_TABLET | Freq: Every day | ORAL | Status: DC

## 2013-07-16 MED ORDER — FERROUS SULFATE 325 (65 FE) MG PO TABS: 325.0000 mg | ORAL_TABLET | Freq: Three times a day (TID) | ORAL | Status: DC

## 2013-07-16 MED ORDER — WARFARIN SODIUM 2.5 MG PO TABS: 2.5000 mg | ORAL_TABLET | Freq: Every day | ORAL | Status: DC

## 2013-07-16 MED ORDER — WARFARIN SODIUM 3 MG PO TABS
3.0000 mg | ORAL_TABLET | Freq: Once | ORAL | Status: DC
  Filled 2013-07-16: qty 1

## 2013-07-16 NOTE — Progress Notes (Signed)
CSW assisting with d/c planning. [facility] has a SNF bed available today if pt is stable for D/C.  Cori Razor LCSW [phone]

## 2013-07-16 NOTE — Anesthesia Postprocedure Evaluation (Signed)
  Anesthesia Post-op Note  Patient: [name]  Procedure(s) Performed: Procedure(s) (LRB): ARTHROPLASTY BIPOLAR HIP (Left)  Patient Location: PACU  Anesthesia Type: General  Level of Consciousness: awake and alert   Airway and Oxygen Therapy: Patient Spontanous Breathing  Post-op Pain: mild  Post-op Assessment: Post-op Vital signs reviewed, Patient's Cardiovascular Status Stable, Respiratory Function Stable, Patent Airway and No signs of Nausea or vomiting  Last Vitals:  Filed Vitals:   07/16/13 0655  BP: 153/83  Pulse: 94  Temp: 36.9 C  Resp: 16    Post-op Vital Signs: stable   Complications: No apparent anesthesia complications

## 2013-07-16 NOTE — Discharge Summary (Signed)
Physician Discharge Summary  [name] ZOX:[id number] DOB: [birth year] DOA: 07/11/2013  PCP: Florentina Jenny, MD  Admit date: 07/11/2013 Discharge date: 07/16/2013  Time spent: > 35 minutes  Recommendations for Outpatient Follow-up:  1. Needs to f/u with Orthopaedic surgeon in 2 weeks 2. Also would check hemoglobin levels in 1 week 3. Pharmacy to check INR daily until therapeutic.  Pharmacy to monitor and dose coumadin per pharmacy at SNF protocol  Discharge Diagnoses:  Principal Problem:   Closed left hip fracture Active Problems:   HTN (hypertension)   Dementia   Hypothyroidism   Hypotension, unspecified   Acute blood loss anemia   Discharge Condition: stable  Diet recommendation: low sodium heart healthy  Filed Weights   07/12/13 0116 07/12/13 1433  Weight: 56.6 kg (124 lb 12.5 oz) 54.613 kg (120 lb 6.4 oz)    History of present illness:  Pt is an 79 y/o with advanced dementia that presented to the ED after an unwitnesed fall.  X ray in ED showed L femoral neck fx.  Hospital Course:  1. Closed left hip fracture -  - Clearted by Ortho for d/c. Ortho evaluated and recommended the following: 3 Days Post-Op Procedure(s) (LRB):  ARTHROPLASTY BIPOLAR HIP (Left)  Discharge to SNF  - D/c on coumadin to be managed by pharmacy at [facility] per pharmacy protocol.  2. Hypotension  - resolved with IVF's and antibiotics although I suspect that it was mainly due to volume depletion and poor oral intake as opposed to actual infection.  3. SIRs criteria  - Was placed on broad spectrum antibiotics yesterday 07/14/13. U/A suspicious for UTI.  - Lactic acid levels WNL and reassuring  - Will d/c on levaquin for 3 more days for suspected UTI.  4. Dementia - chronic and appears to be baseline.   5. Hypothyroidism:  - check tsh levels next am.   6. UTI  - Urine culture negative. Although patient was given abx prior to collection of urine. Will treat with 3 more days of  levaquin.   Procedures:  None  Consultations:  Ortho: Ranee Gosselin  Discharge Exam: Filed Vitals:   07/16/13 0655  BP: 153/83  Pulse: 94  Temp: 98.5 F (36.9 C)  Resp: 16    General: Pt in NAD, Alert and awake Cardiovascular: RRR, no MRG Respiratory: CTA BL, no wheezes  Discharge Instructions  Discharge Orders   Future Orders Complete By Expires   Call MD / Call 911  As directed    Comments:     If you experience chest pain or shortness of breath, CALL 911 and be transported to the hospital emergency room.  If you develope a fever above 101 F, pus (white drainage) or increased drainage or redness at the wound, or calf pain, call your surgeon's office.   Call MD for:  difficulty breathing, headache or visual disturbances  As directed    Call MD for:  severe uncontrolled pain  As directed    Call MD for:  temperature >100.4  As directed    Constipation Prevention  As directed    Comments:     Drink plenty of fluids.  Prune juice may be helpful.  You may use a stool softener, such as Colace (over the counter) 100 mg twice a day.  Use MiraLax (over the counter) for constipation as needed.   Diet - low sodium heart healthy  As directed    Diet - low sodium heart healthy  As directed  Discharge instructions  As directed    Comments:     Walk with your walker. Weight bearing as instructed. Home Health Agency will follow you at home for your therapy and to manage your Coumadin. Maintain dressing for 10-14 day, or until return to the clinic. Shower only, no tub bath. Call if any temperatures greater than 101 or any wound complications: [phone] during the day and ask for Dr. Jeannetta Ellis nurse, [facility].   Discharge instructions  As directed    Comments:     Please continue to monitor INR daily until therapeutic.  Will need to follow up with her Orthopaedic surgeon in 2 weeks   Increase activity slowly as tolerated  As directed    Increase activity slowly  As  directed    Weight bearing as tolerated  As directed        Medication List    STOP taking these medications       aspirin EC 81 MG tablet      TAKE these medications       calcium-vitamin D 500-200 MG-UNIT per tablet  Commonly known as:  OSCAL WITH D  Take 1 tablet by mouth every morning.     ENSURE PLUS Liqd  Take 237 mLs by mouth 2 (two) times daily between meals.     ferrous sulfate 325 (65 FE) MG tablet  Take 1 tablet (325 mg total) by mouth 3 (three) times daily after meals.     HYDROcodone-acetaminophen 5-325 MG per tablet  Commonly known as:  NORCO/VICODIN  Take 1-2 tablets by mouth every 6 (six) hours as needed for pain.     levofloxacin 250 MG tablet  Commonly known as:  LEVAQUIN  Take 1 tablet (250 mg total) by mouth daily.     levothyroxine 50 MCG tablet  Commonly known as:  SYNTHROID, LEVOTHROID  Take 50 mcg by mouth daily before breakfast.     multivitamin with minerals Tabs tablet  Take 1 tablet by mouth every morning.     nitroGLYCERIN 0.4 MG SL tablet  Commonly known as:  NITROSTAT  Place 0.4 mg under the tongue every 5 (five) minutes x 3 doses as needed for chest pain.     senna 8.6 MG Tabs tablet  Commonly known as:  SENOKOT  Take 1 tablet by mouth at bedtime.     warfarin 2.5 MG tablet  Commonly known as:  COUMADIN  Take 1 tablet (2.5 mg total) by mouth daily.       Allergies  Allergen Reactions  . Aricept [Donepezil Hcl] Nausea And Vomiting       Follow-up Information   Follow up with GIOFFRE,RONALD A, MD. Schedule an appointment as soon as possible for a visit in 2 weeks.   Specialty:  Orthopedic Surgery   Contact information:   [address] [place] [address] [phone]        The results of significant diagnostics from this hospitalization (including imaging, microbiology, ancillary and laboratory) are listed below for reference.    Significant Diagnostic Studies: Dg Chest 2 View  07/11/2013    *RADIOLOGY REPORT*  Clinical Data: Preoperative respiratory exam.  Left hip fracture.  CHEST - 2 VIEW  Comparison: 02/17/2013  Findings: The heart size and pulmonary vascularity are normal.  The patient has chronic interstitial and obstructive lung disease.  No acute osseous abnormality.  IMPRESSION: No acute disease.  Chronic lung disease.   Original Report Authenticated By: Francene Boyers, M.D.   Dg  Lumbar Spine Complete  06/26/2013   *RADIOLOGY REPORT*  Clinical Data: Fall.  Low back pain.  LUMBAR SPINE - COMPLETE 4+ VIEW  Comparison: Plain films 02/21/2013.  Findings: No lumbar spine fracture is identified.  There is new sclerosis in the left sacral ala most consistent with fracture. Scoliosis and multilevel degenerative disease again seen.  Left renal calcification is also again noted.  IMPRESSION:  1.  Findings highly suspicious for left sacral ala fracture. 2.  Negative for lumbar spine fracture with degenerative change again identified.   Original Report Authenticated By: Holley Dexter, M.D.   Dg Pelvis 1-2 Views  06/26/2013   *RADIOLOGY REPORT*  Clinical Data: Status post fall.  Low back pain.  PELVIS - 1-2 VIEW  Comparison: Single view of the pelvis 02/21/2013.  Findings: Previously seen right superior and inferior pubic rami fractures demonstrate progressive healing with extensive callus formation present.  There is new sclerosis and cortical irregularity of the sacral ala bilaterally compatible with insufficiency fractures, age indeterminate.  Both hips are located. No hip fracture is identified.  IMPRESSION:  1.  Bilateral sacral insufficiency fractures appear new since the prior exam. 2.  Previously seen right pubic rami fractures demonstrate progressive healing.   Original Report Authenticated By: Holley Dexter, M.D.   Dg Hip Complete Left  07/11/2013   *RADIOLOGY REPORT*  Clinical Data: Left hip pain after fall today.  LEFT HIP - COMPLETE 2+ VIEW  Comparison: 02/17/2013  Findings:  Subcapital femoral neck fracture with superior displacement of the distal fracture fragment resulting in varus angulation.  There is no dislocation of the hip joint.  No focal bone lesion is demonstrated.  Old appearing fracture deformities in the right superior and inferior pubic rami.  No acute or displaced pelvic fractures are demonstrated.  The SI joints and symphysis pubis are not displaced.  IMPRESSION: Left femoral neck fracture with varus angulation of the fracture fragments.  Old fracture deformities of the right superior and inferior pubic rami.   Original Report Authenticated By: Burman Nieves, M.D.   Ct Head Wo Contrast  07/11/2013   *RADIOLOGY REPORT*  Clinical Data:  Altered mental status, tripped and fell  CT HEAD WITHOUT CONTRAST CT MAXILLOFACIAL WITHOUT CONTRAST CT CERVICAL SPINE WITHOUT CONTRAST  Technique:  Multidetector CT imaging of the head, cervical spine, and maxillofacial structures were performed using the standard protocol without intravenous contrast. Multiplanar CT image reconstructions of the cervical spine and maxillofacial structures were also generated.  Comparison:   Prior study from 06/26/2013  CT HEAD  Findings: Diffuse prominence of the CSF containing spaces is similar as compared to the prior study, consistent with atrophy. Scattered and confluent hypodensity within the periventricular white matter is consistent with chronic small vessel ischemic changes.  No acute intracranial hemorrhage or infarct.  No midline shift or mass lesion.  Contusion overlies the left frontal scalp.  Orbits are intact. Mastoid air cells are clear.  IMPRESSION: 1.  No CT evidence of acute intracranial process. 2.  Atrophy with chronic microvascular ischemic changes.  CT MAXILLOFACIAL  Findings:  Study is degraded by motion artifact.Left forehead contusion is noted.  The globes are intact. Paranasal sinuses are clear.  IMPRESSION:  Limited study due to motion.  Left forehead contusion without acute  maxillofacial fracture.  Intact globes.  CT CERVICAL SPINE  Findings:   There is no acute fracture listhesis within the cervical spine. One anterolisthesis of C4 and C5.  Severe degenerative intervertebral disc space narrowing is present at C5-6  and C6-7.  Vertebral body heights are preserved. The normal C1-2 articulations are intact.  IMPRESSION: No acute fracture or listhesis.   Original Report Authenticated By: Rise Mu, M.D.   Ct Head Wo Contrast  06/26/2013   *RADIOLOGY REPORT*  Clinical Data:  Patient found down.  Neck pain.  CT HEAD WITHOUT CONTRAST CT CERVICAL SPINE WITHOUT CONTRAST  Technique:  Multidetector CT imaging of the head and cervical spine was performed following the standard protocol without intravenous contrast.  Multiplanar CT image reconstructions of the cervical spine were also generated.  Comparison:  Head and cervical spine CT scan 02/17/2013.  CT HEAD  Findings: Cortical atrophy and chronic microvascular ischemic change are again seen.  No evidence of acute abnormality including infarction, hemorrhage, mass lesion, mass effect, midline shift or abnormal extra-axial fluid collection is identified.  There is no hydrocephalus or pneumocephalus.  The calvarium is intact.  IMPRESSION: No acute finding.  Atrophy and chronic microvascular ischemic change.  CT CERVICAL SPINE  Findings: No cervical fracture is identified.  Facet mediated anterolisthesis of C4 on C5 is unchanged.  Marked loss of disc space height at C5-6 and C6-7 is seen, unchanged.  Paraspinous structures unremarkable.  IMPRESSION: No acute finding.  Degenerative disease.  Stable compared prior exam.   Original Report Authenticated By: Holley Dexter, M.D.   Ct Cervical Spine Wo Contrast  07/11/2013   *RADIOLOGY REPORT*  Clinical Data:  Altered mental status, tripped and fell  CT HEAD WITHOUT CONTRAST CT MAXILLOFACIAL WITHOUT CONTRAST CT CERVICAL SPINE WITHOUT CONTRAST  Technique:  Multidetector CT imaging of the  head, cervical spine, and maxillofacial structures were performed using the standard protocol without intravenous contrast. Multiplanar CT image reconstructions of the cervical spine and maxillofacial structures were also generated.  Comparison:   Prior study from 06/26/2013  CT HEAD  Findings: Diffuse prominence of the CSF containing spaces is similar as compared to the prior study, consistent with atrophy. Scattered and confluent hypodensity within the periventricular white matter is consistent with chronic small vessel ischemic changes.  No acute intracranial hemorrhage or infarct.  No midline shift or mass lesion.  Contusion overlies the left frontal scalp.  Orbits are intact. Mastoid air cells are clear.  IMPRESSION: 1.  No CT evidence of acute intracranial process. 2.  Atrophy with chronic microvascular ischemic changes.  CT MAXILLOFACIAL  Findings:  Study is degraded by motion artifact.Left forehead contusion is noted.  The globes are intact. Paranasal sinuses are clear.  IMPRESSION:  Limited study due to motion.  Left forehead contusion without acute maxillofacial fracture.  Intact globes.  CT CERVICAL SPINE  Findings:   There is no acute fracture listhesis within the cervical spine. One anterolisthesis of C4 and C5.  Severe degenerative intervertebral disc space narrowing is present at C5-6 and C6-7.  Vertebral body heights are preserved. The normal C1-2 articulations are intact.  IMPRESSION: No acute fracture or listhesis.   Original Report Authenticated By: Rise Mu, M.D.   Ct Cervical Spine Wo Contrast  06/26/2013   *RADIOLOGY REPORT*  Clinical Data:  Patient found down.  Neck pain.  CT HEAD WITHOUT CONTRAST CT CERVICAL SPINE WITHOUT CONTRAST  Technique:  Multidetector CT imaging of the head and cervical spine was performed following the standard protocol without intravenous contrast.  Multiplanar CT image reconstructions of the cervical spine were also generated.  Comparison:  Head and  cervical spine CT scan 02/17/2013.  CT HEAD  Findings: Cortical atrophy and chronic microvascular ischemic change are again seen.  No evidence of acute abnormality including infarction, hemorrhage, mass lesion, mass effect, midline shift or abnormal extra-axial fluid collection is identified.  There is no hydrocephalus or pneumocephalus.  The calvarium is intact.  IMPRESSION: No acute finding.  Atrophy and chronic microvascular ischemic change.  CT CERVICAL SPINE  Findings: No cervical fracture is identified.  Facet mediated anterolisthesis of C4 on C5 is unchanged.  Marked loss of disc space height at C5-6 and C6-7 is seen, unchanged.  Paraspinous structures unremarkable.  IMPRESSION: No acute finding.  Degenerative disease.  Stable compared prior exam.   Original Report Authenticated By: Holley Dexter, M.D.   Dg Pelvis Portable  07/13/2013   *RADIOLOGY REPORT*  Clinical Data: Postop left total hip replacement  PORTABLE PELVIS  Comparison: 06/26/2013  Findings: A new left hip prosthesis is well seated and aligned. There is some soft tissue air adjacent to the left hip consistent with the expected postoperative change.  Old fractures of the right superior and inferior pubic rami are noted, stable.  The bones are diffusely demineralized.  IMPRESSION: Left hip prosthesis is well seated and aligned.  No evidence of an operative complication.   Original Report Authenticated By: Amie Portland, M.D.   Dg Chest Port 1 View  07/14/2013   *RADIOLOGY REPORT*  Clinical Data: Leukocytosis.  Fever.  PORTABLE CHEST - 1 VIEW  Comparison: 07/12/2013  Findings: Coarse reticular opacities in the upper lobes and apices and left lung base are stable consistent with scarring.  No acute findings in the lungs.  Specifically, no infiltrate.  No pleural effusion or pneumothorax is seen.  Cardiac silhouette is normal in size.  No mediastinal or hilar masses are noted.  IMPRESSION: No acute findings.  No change from the recent prior  study.   Original Report Authenticated By: Amie Portland, M.D.   Dg Chest Port 1 View  07/12/2013   *RADIOLOGY REPORT*  Clinical Data: Left femoral neck fracture, preoperative evaluation, shortness of breath, weakness, history hypertension, dementia, smoking  PORTABLE CHEST - 1 VIEW  Comparison: Portable exam 1720 hours compared to 07/11/2013  Findings: Normal heart size and pulmonary vascularity. Calcified tortuous aorta. Lungs appear emphysematous but clear. No pleural effusion or pneumothorax. Bones demineralized. Question calcified mediastinal lymph node.  IMPRESSION: Emphysematous changes. No acute abnormalities.   Original Report Authenticated By: Ulyses Southward, M.D.   Ct Maxillofacial Wo Cm  07/11/2013   *RADIOLOGY REPORT*  Clinical Data:  Altered mental status, tripped and fell  CT HEAD WITHOUT CONTRAST CT MAXILLOFACIAL WITHOUT CONTRAST CT CERVICAL SPINE WITHOUT CONTRAST  Technique:  Multidetector CT imaging of the head, cervical spine, and maxillofacial structures were performed using the standard protocol without intravenous contrast. Multiplanar CT image reconstructions of the cervical spine and maxillofacial structures were also generated.  Comparison:   Prior study from 06/26/2013  CT HEAD  Findings: Diffuse prominence of the CSF containing spaces is similar as compared to the prior study, consistent with atrophy. Scattered and confluent hypodensity within the periventricular white matter is consistent with chronic small vessel ischemic changes.  No acute intracranial hemorrhage or infarct.  No midline shift or mass lesion.  Contusion overlies the left frontal scalp.  Orbits are intact. Mastoid air cells are clear.  IMPRESSION: 1.  No CT evidence of acute intracranial process. 2.  Atrophy with chronic microvascular ischemic changes.  CT MAXILLOFACIAL  Findings:  Study is degraded by motion artifact.Left forehead contusion is noted.  The globes are intact. Paranasal sinuses are clear.  IMPRESSION:   Limited  study due to motion.  Left forehead contusion without acute maxillofacial fracture.  Intact globes.  CT CERVICAL SPINE  Findings:   There is no acute fracture listhesis within the cervical spine. One anterolisthesis of C4 and C5.  Severe degenerative intervertebral disc space narrowing is present at C5-6 and C6-7.  Vertebral body heights are preserved. The normal C1-2 articulations are intact.  IMPRESSION: No acute fracture or listhesis.   Original Report Authenticated By: Rise Mu, M.D.    Microbiology: Recent Results (from the past 240 hour(s))  SURGICAL PCR SCREEN     Status: None   Collection Time    07/12/13  9:07 AM      Result Value Range Status   MRSA, PCR NEGATIVE  NEGATIVE Final   Staphylococcus aureus NEGATIVE  NEGATIVE Final   Comment:            The Xpert SA Assay (FDA     approved for NASAL specimens     in patients over 53 years of age),     is one component of     a comprehensive surveillance     program.  Test performance has     been validated by [employer] for patients greater     than or equal to 70 year old.     It is not intended     to diagnose infection nor to     guide or monitor treatment.  CULTURE, BLOOD (ROUTINE X 2)     Status: None   Collection Time    07/14/13  2:45 PM      Result Value Range Status   Specimen Description BLOOD RIGHT ARM   Final   Special Requests BOTTLES DRAWN AEROBIC AND ANAEROBIC 10 CC EA   Final   Culture  Setup Time     Final   Value: 07/14/2013 19:51     Performed at [facility]   Culture     Final   Value:        BLOOD CULTURE RECEIVED NO GROWTH TO DATE CULTURE WILL BE HELD FOR 5 DAYS BEFORE ISSUING A FINAL NEGATIVE REPORT     Performed at [facility]   Report Status PENDING   Incomplete  CULTURE, BLOOD (ROUTINE X 2)     Status: None   Collection Time    07/14/13  2:55 PM      Result Value Range Status   Specimen Description BLOOD RIGHT ARM   Final   Special Requests BOTTLES  DRAWN AEROBIC AND ANAEROBIC 10 CC EA   Final   Culture  Setup Time     Final   Value: 07/14/2013 19:51     Performed at [facility]   Culture     Final   Value:        BLOOD CULTURE RECEIVED NO GROWTH TO DATE CULTURE WILL BE HELD FOR 5 DAYS BEFORE ISSUING A FINAL NEGATIVE REPORT     Performed at [facility]   Report Status PENDING   Incomplete  URINE CULTURE     Status: None   Collection Time    07/14/13  3:34 PM      Result Value Range Status   Specimen Description URINE, CATHETERIZED   Final   Special Requests NONE   Final   Culture  Setup Time     Final   Value: 07/14/2013 23:55     Performed at [facility]  Count     Final   Value: NO GROWTH     Performed at [facility]   Culture     Final   Value: NO GROWTH     Performed at [facility]   Report Status 07/16/2013 FINAL   Final     Labs: Basic Metabolic Panel:  Recent Labs Lab 07/11/13 2335 07/13/13 0540 07/14/13 0436 07/15/13 0415  NA 136 136 135 136  K 3.9 3.4* 3.9 3.7  CL 100 103 103 104  CO2 25 25 26 26   GLUCOSE 115* 106* 106* 106*  BUN 33* 18 19 21   CREATININE 1.37* 1.24* 1.50* 1.34*  CALCIUM 9.2 8.6 8.4 8.2*   Liver Function Tests:  Recent Labs Lab 07/11/13 2335 07/13/13 0540  AST 23 20  ALT 18 14  ALKPHOS 88 74  BILITOT 0.3 0.6  PROT 6.7 5.9*  ALBUMIN 3.4* 2.8*   No results found for this basename: LIPASE, AMYLASE,  in the last 168 hours No results found for this basename: AMMONIA,  in the last 168 hours CBC:  Recent Labs Lab 07/11/13 2335 07/13/13 0540 07/14/13 0436 07/15/13 0415 07/16/13 0405  WBC 11.2* 7.7 12.2* 11.2* 9.4  NEUTROABS 8.6* 4.4  --   --   --   HGB 11.3* 11.0* 9.4* 8.3* 7.8*  HCT 33.0* 31.1* 28.1* 24.1* 22.7*  MCV 91.4 90.4 92.7 92.0 91.2  PLT 225 177 151 156 197   Cardiac Enzymes: No results found for this basename: CKTOTAL, CKMB, CKMBINDEX, TROPONINI,  in the last 168 hours BNP: BNP (last 3 results) No  results found for this basename: PROBNP,  in the last 8760 hours CBG: No results found for this basename: GLUCAP,  in the last 168 hours     Signed:  [name]  [facility] 07/16/2013, 10:26 AM

## 2013-07-16 NOTE — Progress Notes (Signed)
Subjective: 3 Days Post-Op Procedure(s) (LRB): ARTHROPLASTY BIPOLAR HIP (Left) Patient reports pain as 1 on 0-10 scale.She is more responsive this morning. Her Hbg is 7.8. From the ortho standpoint she may be discharged.    Objective: Vital signs in last 24 hours: Temp:  [98.1 F (36.7 C)-98.8 F (37.1 C)] 98.5 F (36.9 C) (08/26 0655) Pulse Rate:  [69-94] 94 (08/26 0655) Resp:  [16-20] 16 (08/26 0655) BP: (96-153)/(40-83) 153/83 mmHg (08/26 0655) SpO2:  [92 %-97 %] 92 % (08/26 0655)  Intake/Output from previous day: 08/25 0701 - 08/26 0700 In: 2065 [P.O.:320; I.V.:1745] Out: 1375 [Urine:1375] Intake/Output this shift: Total I/O In: 240 [P.O.:240] Out: -    Recent Labs  07/14/13 0436 07/15/13 0415 07/16/13 0405  HGB 9.4* 8.3* 7.8*    Recent Labs  07/15/13 0415 07/16/13 0405  WBC 11.2* 9.4  RBC 2.62* 2.49*  HCT 24.1* 22.7*  PLT 156 197    Recent Labs  07/14/13 0436 07/15/13 0415  NA 135 136  K 3.9 3.7  CL 103 104  CO2 26 26  BUN 19 21  CREATININE 1.50* 1.34*  GLUCOSE 106* 106*  CALCIUM 8.4 8.2*    Recent Labs  07/15/13 0415 07/16/13 0405  INR 1.28 1.20    Dorsiflexion/Plantar flexion intact  Assessment/Plan: 3 Days Post-Op Procedure(s) (LRB): ARTHROPLASTY BIPOLAR HIP (Left) Discharge to SNF  Margrete Delude A 07/16/2013, 9:00 AM

## 2013-07-16 NOTE — Progress Notes (Signed)
Clinical Social Work Department CLINICAL SOCIAL WORK PLACEMENT NOTE 07/16/2013  Patient:  [name], [name]  Account Number:  [id number] Admit date:  07/11/2013  Clinical Social Worker:  Doroteo Glassman  Date/time:  07/14/2013 09:33 AM  Clinical Social Work is seeking post-discharge placement for this patient at the following level of care:   SKILLED NURSING   (*CSW will update this form in Epic as items are completed)   07/14/2013  Patient/family provided with [facility] Department of Clinical Social Work's list of facilities offering this level of care within the geographic area requested by the patient (or if unable, by the patient's family).  07/14/2013  Patient/family informed of their freedom to choose among providers that offer the needed level of care, that participate in Medicare, Medicaid or managed care program needed by the patient, have an available bed and are willing to accept the patient.  07/14/2013  Patient/family informed of [facility]' ownership interest in [facility], as well as of the fact that they are under no obligation to receive care at this facility.  PASARR submitted to EDS on 04/27/2012 PASARR number received from EDS on 04/27/2012  FL2 transmitted to all facilities in geographic area requested by pt/family on  07/14/2013 FL2 transmitted to all facilities within larger geographic area on   Patient informed that his/her managed care company has contracts with or will negotiate with  certain facilities, including the following:     Patient/family informed of bed offers received:  07/15/2013 Patient chooses bed at [facility], [place] Physician recommends and patient chooses bed at    [facility] [place] on  07/16/2013 Patient to be transferred to facility by [facility]  The following physician request were entered in Epic:   Additional Comments: Family aware ambulance  transport may not be covered by insurance.  Cori Razor LCSW [phone]

## 2013-07-16 NOTE — Progress Notes (Signed)
ANTICOAGULATION CONSULT NOTE - Follow Up Consult  Pharmacy Consult for Coumadin  Indication: VTE prophylaxis  Labs:  Recent Labs  07/14/13 0436 07/15/13 0415 07/16/13 0405  HGB 9.4* 8.3* 7.8*  HCT 28.1* 24.1* 22.7*  PLT 151 156 197  LABPROT 14.5 15.7* 14.9  INR 1.15 1.28 1.20  CREATININE 1.50* 1.34*  --     Estimated Creatinine Clearance: 26.9 ml/min (by C-G formula based on Cr of 1.34).    Assessment: 79 yo from SNF presented to ER 8/21 after witnessed fall found to have a left femoral neck fracture. Patient is now s/p L arthroplasty bipolar hip for this fracture performed 8/23. Warfarin started post-op for VTE prophylaxis.  Coumadin score = 0 given age of 64, wt of 55kg, female, and indication of VTE ppx.    INR was responding to Coumadin but now trended down to 1.2 on Coumadin 2.5mg  x 3 days.  Drug-drug interaction with Levaquin noted and will monitor. Unable to provide Coumadin education to patient given h/o dementia and no family member around. Plan for SNF today. Hgb 7.8 (no bleeding noted)   Goal of Therapy:  INR 2-3 Monitor platelets by anticoagulation protocol: Yes   Plan:   Coumadin 3mg  po x 1 tonight  Would recommend discharge on Coumadin 2.5mg  po daily given factors mentioned above. Please monitor INR closely  PT/INR daily while inpatient  F/u discontinuation of abx? Needs for PRBCs?   Geoffry Paradise, PharmD, BCPS Pager: [phone] 7:55 AM Pharmacy #: [phone]

## 2013-07-17 LAB — TYPE AND SCREEN
ABO/RH(D): A POS
Antibody Screen: NEGATIVE
Unit division: 0

## 2013-07-20 LAB — CULTURE, BLOOD (ROUTINE X 2): Culture: NO GROWTH

## 2013-08-16 ENCOUNTER — Encounter (HOSPITAL_COMMUNITY): Payer: Self-pay | Admitting: *Deleted

## 2013-08-16 ENCOUNTER — Emergency Department (HOSPITAL_COMMUNITY)
Admission: EM | Admit: 2013-08-16 | Discharge: 2013-08-17 | Disposition: A | Discharge status: Home / Self Care | Payer: Medicare Other | Attending: Emergency Medicine | Admitting: Emergency Medicine

## 2013-08-16 DIAGNOSIS — S0180XA Unspecified open wound of other part of head, initial encounter: Secondary | ICD-10-CM | POA: Insufficient documentation

## 2013-08-16 DIAGNOSIS — F0280 Dementia in other diseases classified elsewhere without behavioral disturbance: Secondary | ICD-10-CM | POA: Insufficient documentation

## 2013-08-16 DIAGNOSIS — S0181XA Laceration without foreign body of other part of head, initial encounter: Secondary | ICD-10-CM

## 2013-08-16 DIAGNOSIS — Z8781 Personal history of (healed) traumatic fracture: Secondary | ICD-10-CM | POA: Insufficient documentation

## 2013-08-16 DIAGNOSIS — E039 Hypothyroidism, unspecified: Secondary | ICD-10-CM | POA: Insufficient documentation

## 2013-08-16 DIAGNOSIS — F17200 Nicotine dependence, unspecified, uncomplicated: Secondary | ICD-10-CM | POA: Insufficient documentation

## 2013-08-16 DIAGNOSIS — I10 Essential (primary) hypertension: Secondary | ICD-10-CM | POA: Insufficient documentation

## 2013-08-16 DIAGNOSIS — Z8739 Personal history of other diseases of the musculoskeletal system and connective tissue: Secondary | ICD-10-CM | POA: Insufficient documentation

## 2013-08-16 DIAGNOSIS — Z87442 Personal history of urinary calculi: Secondary | ICD-10-CM | POA: Insufficient documentation

## 2013-08-16 DIAGNOSIS — Z7901 Long term (current) use of anticoagulants: Secondary | ICD-10-CM | POA: Insufficient documentation

## 2013-08-16 DIAGNOSIS — Z79899 Other long term (current) drug therapy: Secondary | ICD-10-CM | POA: Insufficient documentation

## 2013-08-16 DIAGNOSIS — Z792 Long term (current) use of antibiotics: Secondary | ICD-10-CM | POA: Insufficient documentation

## 2013-08-16 DIAGNOSIS — Y9210 Unspecified residential institution as the place of occurrence of the external cause: Secondary | ICD-10-CM | POA: Insufficient documentation

## 2013-08-16 DIAGNOSIS — I2510 Atherosclerotic heart disease of native coronary artery without angina pectoris: Secondary | ICD-10-CM | POA: Insufficient documentation

## 2013-08-16 DIAGNOSIS — W1809XA Striking against other object with subsequent fall, initial encounter: Secondary | ICD-10-CM | POA: Insufficient documentation

## 2013-08-16 DIAGNOSIS — W050XXA Fall from non-moving wheelchair, initial encounter: Secondary | ICD-10-CM | POA: Insufficient documentation

## 2013-08-16 DIAGNOSIS — Z86718 Personal history of other venous thrombosis and embolism: Secondary | ICD-10-CM | POA: Insufficient documentation

## 2013-08-16 DIAGNOSIS — Y939 Activity, unspecified: Secondary | ICD-10-CM | POA: Insufficient documentation

## 2013-08-16 DIAGNOSIS — D649 Anemia, unspecified: Secondary | ICD-10-CM | POA: Insufficient documentation

## 2013-08-16 DIAGNOSIS — G309 Alzheimer's disease, unspecified: Secondary | ICD-10-CM | POA: Insufficient documentation

## 2013-08-16 HISTORY — DX: Alzheimer's disease, unspecified: G30.9

## 2013-08-16 HISTORY — DX: Acute embolism and thrombosis of unspecified deep veins of unspecified lower extremity: I82.409

## 2013-08-16 HISTORY — DX: Dementia in other diseases classified elsewhere, unspecified severity, without behavioral disturbance, psychotic disturbance, mood disturbance, and anxiety: F02.80

## 2013-08-16 HISTORY — DX: Anemia, unspecified: D64.9

## 2013-08-16 HISTORY — DX: Atherosclerotic heart disease of native coronary artery without angina pectoris: I25.10

## 2013-08-16 HISTORY — DX: Scoliosis, unspecified: M41.9

## 2013-08-16 HISTORY — DX: Fracture of unspecified parts of lumbosacral spine and pelvis, initial encounter for closed fracture: S32.9XXA

## 2013-08-16 NOTE — ED Notes (Signed)
PT to ER via EMS from [facility]; pt had a fall out of wheelchair onto the floor; pt struck her chin on the wheelchair; laceration to chin; bleeding controlled upon arrival; Pt is a DNR with a history of Alzheimer's and confusion

## 2013-08-16 NOTE — ED Provider Notes (Signed)
CSN: [id number]     Arrival date & time 08/16/13  2126 History   First MD Initiated Contact with Patient 08/16/13 2133     Chief Complaint  Patient presents with  . Fall  . Facial Laceration   (Consider location/radiation/quality/duration/timing/severity/associated sxs/prior Treatment) HPI Patient presents from her nursing facility after a witnessed fall.  Per report she fell from her wheelchair onto the floor.  There is no report of loss of consciousness, or any subsequent change in behavior or activity. Patient does have dementia, this is a level V caveat. However, the patient is awake and alert, answering simple questions appropriate he on my exam.  She denies pain, broken teeth, confusion, weakness anywhere.  Past Medical History  Diagnosis Date  . Hypertension   . Hypothyroidism   . Mental disorder   . DEMENTIA   . Anxiety   . Chest pain 2013  . Weakness   . Dysphagia   . Muscle/ligament disorder   . Speech disorder   . Fracture of right superior rim of pubis with routine healing     right superior and pubi rami fracture  . Pelvis fracture   . DVT (deep venous thrombosis)   . Alzheimer's dementia   . Anemia   . Renal disorder     kidney stones  . Scoliosis   . CAD (coronary artery disease)    Past Surgical History  Procedure Laterality Date  . Skin graft      to left leg  . Hip arthroplasty Left 07/13/2013    Procedure: ARTHROPLASTY BIPOLAR HIP;  Surgeon: Jacki Cones, MD;  Location: [facility];  Service: Orthopedics;  Laterality: Left;  . Cholecystectomy     Family History  Problem Relation Age of Onset  . Coronary aneurysm Father   . Heart failure Sister    History  Substance Use Topics  . Smoking status: Current Some Day Smoker -- 63 years  . Smokeless tobacco: Never Used  . Alcohol Use: No   OB History   Grav Para Term Preterm Abortions TAB SAB Ect Mult Living                 Review of Systems  Unable to perform ROS: Dementia    Allergies    Aricept  Home Medications   Current Outpatient Rx  Name  Route  Sig  Dispense  Refill  . calcium-vitamin D (OSCAL WITH D) 500-200 MG-UNIT per tablet   Oral   Take 1 tablet by mouth every morning.         Marland Kitchen ENSURE PLUS (ENSURE PLUS) LIQD   Oral   Take 237 mLs by mouth 2 (two) times daily between meals.         . ferrous sulfate 325 (65 FE) MG tablet   Oral   Take 1 tablet (325 mg total) by mouth 3 (three) times daily after meals.   90 tablet   0   . HYDROcodone-acetaminophen (NORCO/VICODIN) 5-325 MG per tablet   Oral   Take 1-2 tablets by mouth every 6 (six) hours as needed for pain.   30 tablet   0   . levofloxacin (LEVAQUIN) 250 MG tablet   Oral   Take 1 tablet (250 mg total) by mouth daily.   3 tablet   0   . levothyroxine (SYNTHROID, LEVOTHROID) 50 MCG tablet   Oral   Take 50 mcg by mouth daily before breakfast.         . Multiple Vitamin (MULTIVITAMIN  WITH MINERALS) TABS tablet   Oral   Take 1 tablet by mouth every morning.         . nitroGLYCERIN (NITROSTAT) 0.4 MG SL tablet   Sublingual   Place 0.4 mg under the tongue every 5 (five) minutes x 3 doses as needed for chest pain.         Marland Kitchen senna (SENOKOT) 8.6 MG TABS tablet   Oral   Take 1 tablet by mouth at bedtime.         Marland Kitchen warfarin (COUMADIN) 2.5 MG tablet   Oral   Take 1 tablet (2.5 mg total) by mouth daily.   15 tablet   0     Please continue to monitor INR daily until therape ...    BP 125/46  Pulse 66  Temp(Src) 98.5 F (36.9 C) (Oral)  Resp 12  SpO2 98% Physical Exam  Nursing note and vitals reviewed. Constitutional: She is oriented to person, place, and time. She appears well-developed and well-nourished. No distress.  HENT:  Head: Normocephalic and atraumatic.    Mouth/Throat: Uvula is midline.  Dentures superiorly, no broken teeth inferiorly  Eyes: Conjunctivae and EOM are normal.  Neck: Full passive range of motion without pain. No spinous process tenderness and no  muscular tenderness present. No rigidity. No edema present.  Cardiovascular: Normal rate and regular rhythm.   Pulmonary/Chest: Effort normal and breath sounds normal. No stridor. No respiratory distress.  Abdominal: She exhibits no distension.  Musculoskeletal: She exhibits no edema.  Neurological: She is alert and oriented to person, place, and time. No cranial nerve deficit.  Skin: Skin is warm and dry.  Psychiatric: She has a normal mood and affect. She is slowed and withdrawn. Cognition and memory are impaired.    ED Course  Procedures (including critical care time) Labs Review Labs Reviewed - No data to display Imaging Review No results found.  MDM   1. Facial laceration, initial encounter    This elderly female with dementia presents after a witnessed fall at her nursing facility.  Notably, the patient's wound was previously dressed.  Patient is DO NOT RESUSCITATE, with notable dementia.  With no report of loss of consciousness, or any subsequent weakness, complaints, there is low suspicion for intracranial hemorrhage, though the patient is an Coumadin.  However, the patient is returning to a monitored facility, which is appropriate given her resuscitation status, age, dementia.    Gerhard Munch, MD 08/16/13 2209

## 2013-08-16 NOTE — ED Notes (Signed)
Wound has 3 steri strips in place from nursing center.  Per MD, dabbed wound with saline gauze and placed dry dressing.  Bleeding minimal and controlled.  Pt waiting [facility] transport.

## 2013-08-17 NOTE — ED Notes (Signed)
Notified again for [facility]

## 2014-01-02 ENCOUNTER — Emergency Department (HOSPITAL_COMMUNITY): Payer: Medicare Other

## 2014-01-02 ENCOUNTER — Emergency Department (HOSPITAL_COMMUNITY)
Admission: EM | Admit: 2014-01-02 | Discharge: 2014-01-02 | Disposition: A | Discharge status: Home / Self Care | Payer: Medicare Other | Attending: Emergency Medicine | Admitting: Emergency Medicine

## 2014-01-02 ENCOUNTER — Encounter (HOSPITAL_COMMUNITY): Payer: Self-pay | Admitting: Emergency Medicine

## 2014-01-02 DIAGNOSIS — F411 Generalized anxiety disorder: Secondary | ICD-10-CM | POA: Insufficient documentation

## 2014-01-02 DIAGNOSIS — D649 Anemia, unspecified: Secondary | ICD-10-CM | POA: Insufficient documentation

## 2014-01-02 DIAGNOSIS — Z8781 Personal history of (healed) traumatic fracture: Secondary | ICD-10-CM | POA: Insufficient documentation

## 2014-01-02 DIAGNOSIS — F17200 Nicotine dependence, unspecified, uncomplicated: Secondary | ICD-10-CM | POA: Insufficient documentation

## 2014-01-02 DIAGNOSIS — Z79899 Other long term (current) drug therapy: Secondary | ICD-10-CM | POA: Insufficient documentation

## 2014-01-02 DIAGNOSIS — Y9210 Unspecified residential institution as the place of occurrence of the external cause: Secondary | ICD-10-CM | POA: Insufficient documentation

## 2014-01-02 DIAGNOSIS — S42209A Unspecified fracture of upper end of unspecified humerus, initial encounter for closed fracture: Secondary | ICD-10-CM | POA: Insufficient documentation

## 2014-01-02 DIAGNOSIS — Z7982 Long term (current) use of aspirin: Secondary | ICD-10-CM | POA: Insufficient documentation

## 2014-01-02 DIAGNOSIS — I10 Essential (primary) hypertension: Secondary | ICD-10-CM | POA: Insufficient documentation

## 2014-01-02 DIAGNOSIS — E039 Hypothyroidism, unspecified: Secondary | ICD-10-CM | POA: Insufficient documentation

## 2014-01-02 DIAGNOSIS — Z86718 Personal history of other venous thrombosis and embolism: Secondary | ICD-10-CM | POA: Insufficient documentation

## 2014-01-02 DIAGNOSIS — F0280 Dementia in other diseases classified elsewhere without behavioral disturbance: Secondary | ICD-10-CM | POA: Insufficient documentation

## 2014-01-02 DIAGNOSIS — M4120 Other idiopathic scoliosis, site unspecified: Secondary | ICD-10-CM | POA: Insufficient documentation

## 2014-01-02 DIAGNOSIS — Z87442 Personal history of urinary calculi: Secondary | ICD-10-CM | POA: Insufficient documentation

## 2014-01-02 DIAGNOSIS — W19XXXA Unspecified fall, initial encounter: Secondary | ICD-10-CM | POA: Insufficient documentation

## 2014-01-02 DIAGNOSIS — G309 Alzheimer's disease, unspecified: Secondary | ICD-10-CM | POA: Insufficient documentation

## 2014-01-02 DIAGNOSIS — Y939 Activity, unspecified: Secondary | ICD-10-CM | POA: Insufficient documentation

## 2014-01-02 DIAGNOSIS — S42202A Unspecified fracture of upper end of left humerus, initial encounter for closed fracture: Secondary | ICD-10-CM

## 2014-01-02 DIAGNOSIS — Z993 Dependence on wheelchair: Secondary | ICD-10-CM | POA: Insufficient documentation

## 2014-01-02 DIAGNOSIS — I2510 Atherosclerotic heart disease of native coronary artery without angina pectoris: Secondary | ICD-10-CM | POA: Insufficient documentation

## 2014-01-02 MED ORDER — FENTANYL CITRATE 0.05 MG/ML IJ SOLN: 50.0000 ug | INTRAMUSCULAR | Status: DC | PRN

## 2014-01-02 MED ORDER — HYDROCODONE-ACETAMINOPHEN 5-325 MG PO TABS: 1.0000 | ORAL_TABLET | Freq: Four times a day (QID) | ORAL | Status: AC | PRN

## 2014-01-02 NOTE — ED Notes (Signed)
Brought in by EMS from [facility] facility after her fall this morning. Per EMS, staff at the facility reported that pt was found on floor lying on her left side beside her bed; pt c/o left shoulder pain after her unwitnessed fall.  Pt was given Fentanyl 100 mcg IV en route to ED.

## 2014-01-02 NOTE — ED Notes (Signed)
[facility] facility was called; staff stated that pt is non-ambulatory (wheelchair-confined).

## 2014-01-02 NOTE — ED Notes (Signed)
Pt is wheel chair confined.

## 2014-01-02 NOTE — ED Provider Notes (Signed)
CSN: [id number]     Arrival date & time 01/02/14  0229 History   First MD Initiated Contact with Patient 01/02/14 [phone]     Chief Complaint  Patient presents with  . Fall  . Shoulder Pain     (Consider location/radiation/quality/duration/timing/severity/associated sxs/prior Treatment) HPI History provided by EMS and nursing home report. Left shoulder pain after fall at nursing facility. Unwitnessed fall. No head injury. Patient unable to move her left arm due to pain. No apparent weakness. No bleeding. No chest pain or abdominal pain. No difficulty breathing. Has deformity to left proximal humerus. History of dementia. He is wheelchair-bound. Patient is a limited historian.  level 5 caveat applies Past Medical History  Diagnosis Date  . Hypertension   . Hypothyroidism   . Mental disorder   . DEMENTIA   . Anxiety   . Chest pain 2013  . Weakness   . Dysphagia   . Muscle/ligament disorder   . Speech disorder   . Fracture of right superior rim of pubis with routine healing     right superior and pubi rami fracture  . Pelvis fracture   . DVT (deep venous thrombosis)   . Alzheimer's dementia   . Anemia   . Renal disorder     kidney stones  . Scoliosis   . CAD (coronary artery disease)    Past Surgical History  Procedure Laterality Date  . Skin graft      to left leg  . Hip arthroplasty Left 07/13/2013    Procedure: ARTHROPLASTY BIPOLAR HIP;  Surgeon: Jacki Cones, MD;  Location: [facility];  Service: Orthopedics;  Laterality: Left;  . Cholecystectomy     Family History  Problem Relation Age of Onset  . Coronary aneurysm Father   . Heart failure Sister    History  Substance Use Topics  . Smoking status: Current Some Day Smoker -- 63 years  . Smokeless tobacco: Never Used  . Alcohol Use: No   OB History   Grav Para Term Preterm Abortions TAB SAB Ect Mult Living                 Review of Systems  Unable to perform ROS  level V caveat dementia    Allergies   Aricept  Home Medications   Current Outpatient Rx  Name  Route  Sig  Dispense  Refill  . Alum & Mag Hydroxide-Simeth (MAGIC MOUTHWASH) SOLN   Oral   Take 5 mLs by mouth 3 (three) times daily.         Marland Kitchen aspirin EC 81 MG tablet   Oral   Take 81 mg by mouth daily.         . calcium-vitamin D (OSCAL WITH D) 500-200 MG-UNIT per tablet   Oral   Take 1 tablet by mouth every morning.         . ferrous sulfate 325 (65 FE) MG tablet   Oral   Take 325 mg by mouth daily with breakfast.         . guaifenesin (ROBITUSSIN) 100 MG/5ML syrup   Oral   Take 300 mg by mouth 3 (three) times daily as needed for cough.         Marland Kitchen HYDROcodone-acetaminophen (NORCO/VICODIN) 5-325 MG per tablet   Oral   Take 1-2 tablets by mouth every 6 (six) hours as needed for pain.   30 tablet   0   . levothyroxine (SYNTHROID, LEVOTHROID) 50 MCG tablet   Oral  Take 50 mcg by mouth daily before breakfast.         . Multiple Vitamin (MULTIVITAMIN WITH MINERALS) TABS tablet   Oral   Take 1 tablet by mouth every morning.         . nitroGLYCERIN (NITROSTAT) 0.4 MG SL tablet   Sublingual   Place 0.4 mg under the tongue every 5 (five) minutes x 3 doses as needed for chest pain.         . polyethylene glycol (MIRALAX / GLYCOLAX) packet   Oral   Take 17 g by mouth daily.         Marland Kitchen. senna (SENOKOT) 8.6 MG TABS tablet   Oral   Take 1 tablet by mouth at bedtime.          BP 142/61  Pulse 73  Temp(Src) 98.7 F (37.1 C) (Oral)  Resp 20  SpO2 98% Physical Exam  Constitutional: She appears well-developed and well-nourished.  HENT:  Head: Normocephalic and atraumatic.  Eyes: EOM are normal. Pupils are equal, round, and reactive to light.  Neck: Neck supple.  Cardiovascular: Regular rhythm and intact distal pulses.   Pulmonary/Chest: Effort normal. No respiratory distress.  Musculoskeletal:  Left upper extremity tender over the shoulder and proximal humerus with some swelling and  decreased range of motion at the shoulder secondary to pain. No tenderness at the elbow or wrist with equal radial pulses. Distal motor and gross sensorium intact  Neurological:  Awake and alert, no facial droop  Skin: Skin is warm and dry.    ED Course  Procedures (including critical care time) Labs Review Labs Reviewed - No data to display Imaging Review Dg Shoulder Left  01/02/2014   CLINICAL DATA:  Fall.  Shoulder pain.  EXAM: LEFT SHOULDER - 2+ VIEW  COMPARISON:  None.  FINDINGS: There is a comminuted fracture of the proximal left humerus. Fracture is present through the metaphysis, surgical and anatomic necks. Mild apex lateral and superior angulation. Swelling is present in the deltoid. Humeral head is internally rotated. The humeral head appears to remain located on these frontal views of the left shoulder. Visualized left chest shows pleural apical scarring but no acute abnormality.  IMPRESSION: Comminuted proximal left humerus fracture, mildly displaced.   Electronically Signed   By: Andreas NewportGeoffrey  Lamke M.D.   On: 01/02/2014 04:08   Dg Humerus Left  01/02/2014   CLINICAL DATA:  Fall.  Shoulder pain.  EXAM: LEFT HUMERUS - 2+ VIEW  COMPARISON:  DG HAND COMPLETE*L* dated 02/17/2013; DG SHOULDER *L* dated 01/02/2014  FINDINGS: Partially visualized proximal humerus fracture. The distal humerus appears within normal limits. See shoulder dictation.  IMPRESSION: Intact distal humerus. Proximal humerus fracture. See shoulder dictation.   Electronically Signed   By: Andreas NewportGeoffrey  Lamke M.D.   On: 01/02/2014 04:07    EKG Interpretation    Date/Time:  Thursday January 02 2014 03:36:14 EST Ventricular Rate:  67 PR Interval:  164 QRS Duration: 107 QT Interval:  423 QTC Calculation: 446 R Axis:   75 Text Interpretation:  Sinus rhythm Nonspecific ST abnormality Baseline wander No significant change since last tracing Confirmed by Eulalia Ellerman  MD, Sherlonda Flater [phone] on 01/02/2014 4:15:54 AM           Called  to the patient's nursing home and confirmed she is wheelchair-bound.  IV fentanyl provided as needed for pain. Sling provided.  plan discharge back to her facility with orthopedic referral. Pain medications as needed.  MDM   Dx: Closed left  humerus fracture  Evaluated with EKG, imaging reviewed as above. IV fentanyl pain control Sling Vital signs and nursing notes reviewed and considered  Sunnie Nielsen, MD 01/02/14 [phone]

## 2014-01-02 NOTE — ED Notes (Signed)
Bed: WA09 Expected date:  Expected time:  Means of arrival:  Comments: 80 yo F  Fall

## 2014-01-02 NOTE — Discharge Instructions (Signed)
Humerus Fracture, Treated with Immobilization °The humerus is the large bone in your upper arm. You have a broken (fractured) humerus. These fractures are easily diagnosed with X-rays. °TREATMENT  °Simple fractures which will heal without disability are treated with simple immobilization. Immobilization means you will wear a cast, splint, or sling. You have a fracture which will do well with immobilization. The fracture will heal well simply by being held in a good position until it is stable enough to begin range of motion exercises. Do not take part in activities which would further injure your arm.  °HOME CARE INSTRUCTIONS  °· Put ice on the injured area. °· Put ice in a plastic bag. °· Place a towel between your skin and the bag. °· Leave the ice on for 15-20 minutes, 03-04 times a day. °· If you have a cast: °· Do not scratch the skin under the cast using sharp or pointed objects. °· Check the skin around the cast every day. You may put lotion on any red or sore areas. °· Keep your cast dry and clean. °· If you have a splint: °· Wear the splint as directed. °· Keep your splint dry and clean. °· You may loosen the elastic around the splint if your fingers become numb, tingle, or turn cold or blue. °· If you have a sling: °· Wear the sling as directed. °· Do not put pressure on any part of your cast or splint until it is fully hardened. °· Your cast or splint can be protected during bathing with a plastic bag. Do not lower the cast or splint into water. °· Only take over-the-counter or prescription medicines for pain, discomfort, or fever as directed by your caregiver. °· Do range of motion exercises as instructed by your caregiver. °· Follow up as directed by your caregiver. This is very important in order to avoid permanent injury or disability and chronic pain. °SEEK IMMEDIATE MEDICAL CARE IF:  °· Your skin or nails in the injured arm turn blue or gray. °· Your arm feels cold or numb. °· You develop severe  pain in the injured arm. °· You are having problems with the medicines you were given. °MAKE SURE YOU:  °· Understand these instructions. °· Will watch your condition. °· Will get help right away if you are not doing well or get worse. °Document Released: 02/13/2001 Document Revised: 01/30/2012 Document Reviewed: 12/22/2010 °[employer]® Patient Information ©2014 [employer]. ° °

## 2014-02-19 DEATH — deceased

## 2015-01-30 IMAGING — CT CT HEAD W/O CM
4 series · 17 of 30 positions shown, 19 images · non-contrast
Comparison: Head and cervical spine CT scan 02/17/2013.

CT HEAD

CLINICAL DATA: Patient found down.  Neck pain.

CT HEAD WITHOUT CONTRAST
CT CERVICAL SPINE WITHOUT CONTRAST
TECHNIQUE: Multidetector CT imaging of the head and cervical spine
was performed following the standard protocol without intravenous
contrast.  Multiplanar CT image reconstructions of the cervical
spine were also generated.

[Series 3: head w/o · axial · non-contrast · 0.43mm/px · z∈[-73,-23]mm · 2 of 32 slices shown]
[im 11/32  brain]
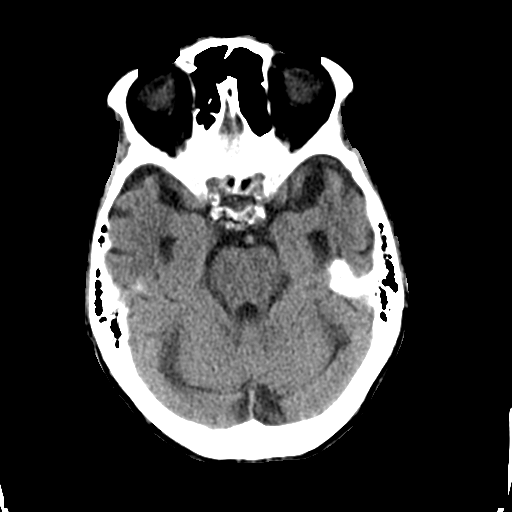
[im 21/32  brain]
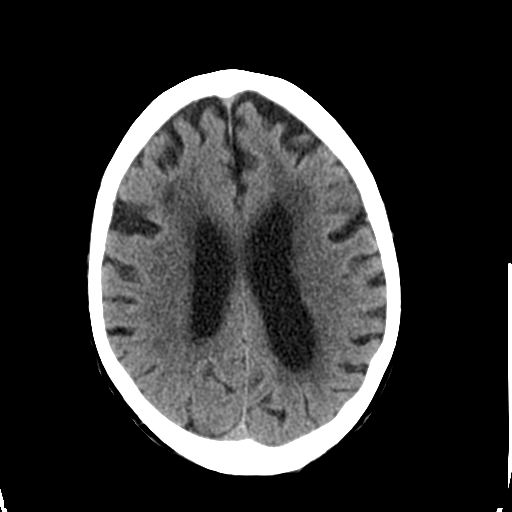

[Series 4: bone windows · axial · 0.43mm/px · z∈[-93,+0]mm · 4 of 53 slices shown]
[im 11/53  bone]
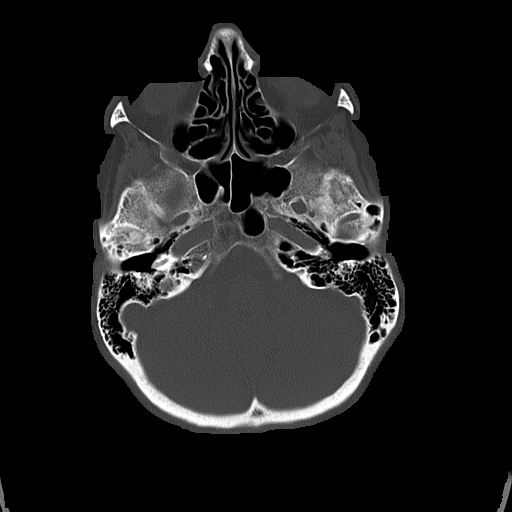
[im 21/53  bone]
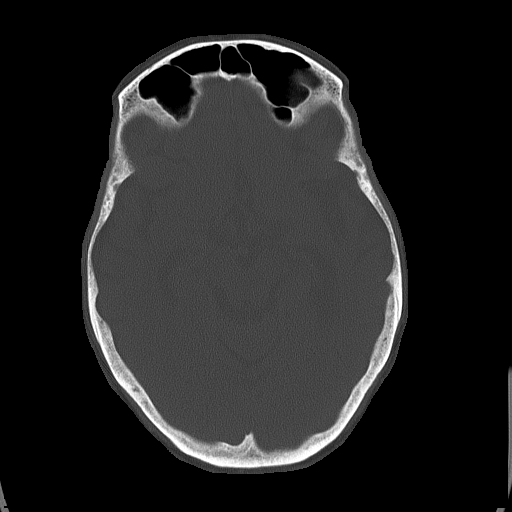
[im 32/53  bone]
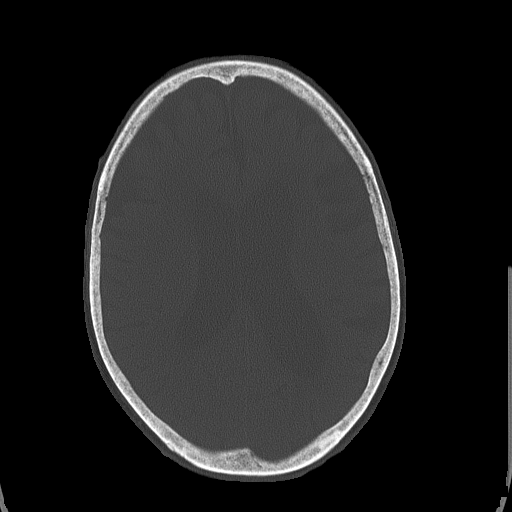
[im 42/53  bone]
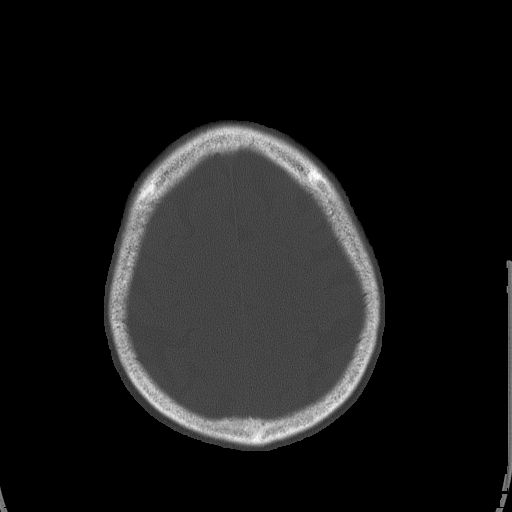

[Series 5: c-spine st · axial · 0.30mm/px · z∈[-264,-226]mm · 3 of 87 slices shown]
[im 10/87  brain]
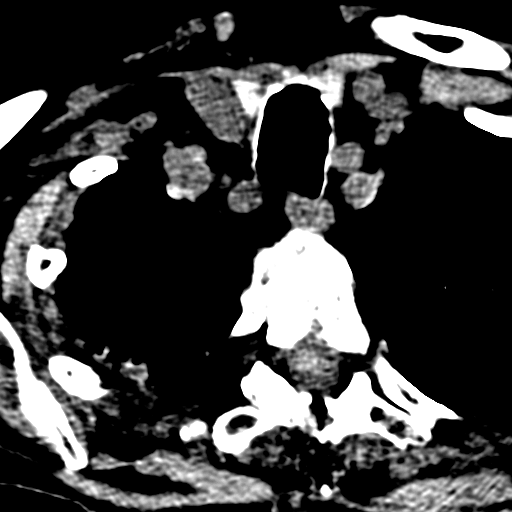
[im 20/87  brain]
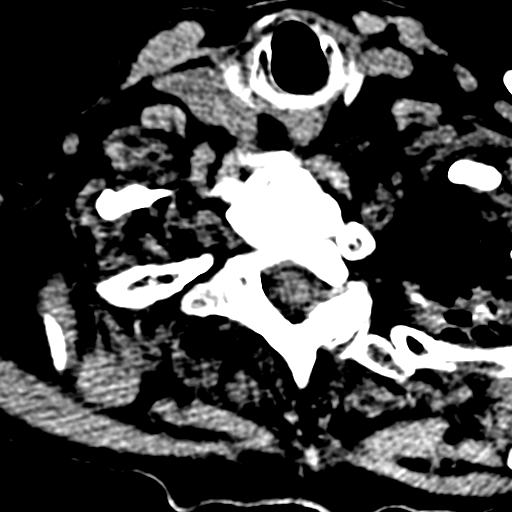
[im 29/87  brain]
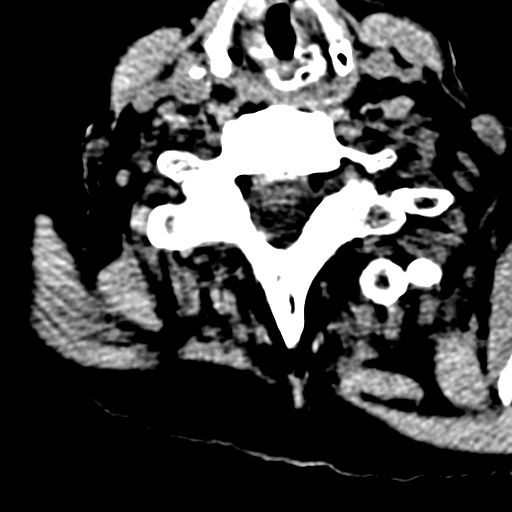

[Series 7: axial recon · axial · 0.22mm/px · z∈[-285,-151]mm · 8 of 92 slices shown, 10 images]
[im 11/92  brain]
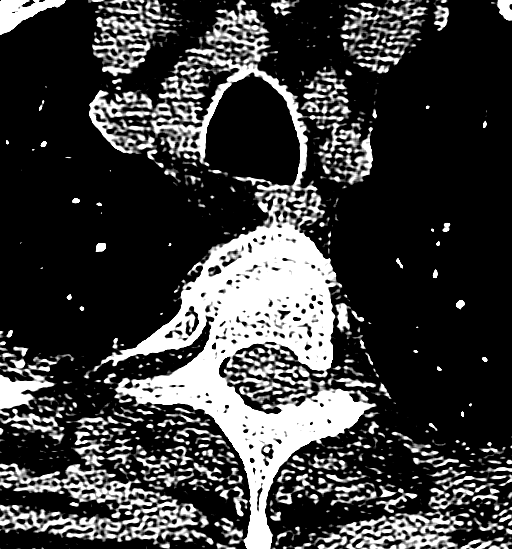
[im 11/92  bone]
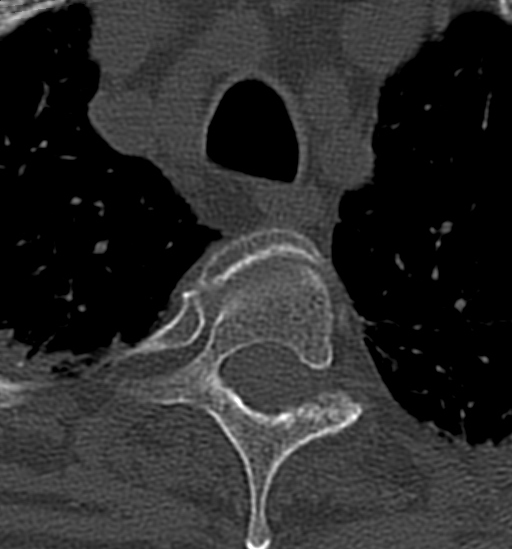
[im 21/92  brain]
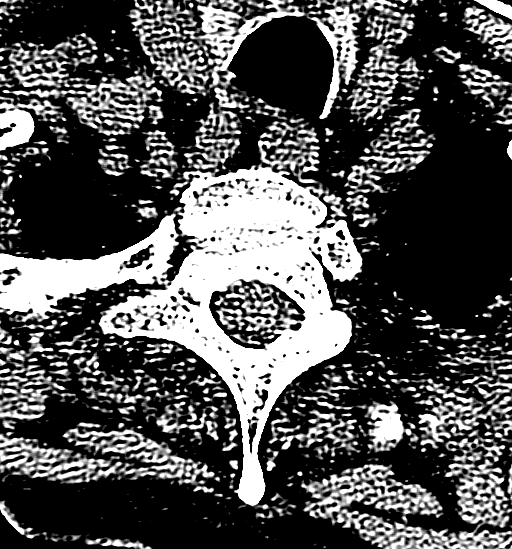
[im 31/92  brain]
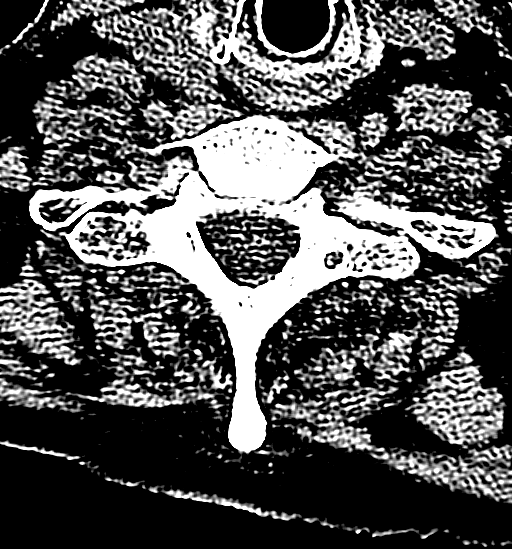
[im 41/92  brain]
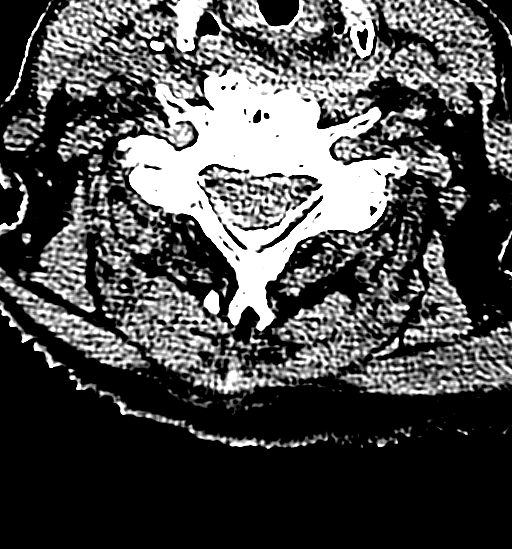
[im 51/92  brain]
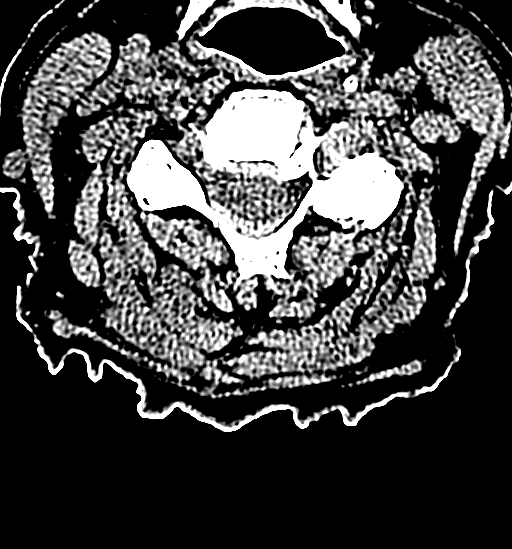
[im 51/92  bone]
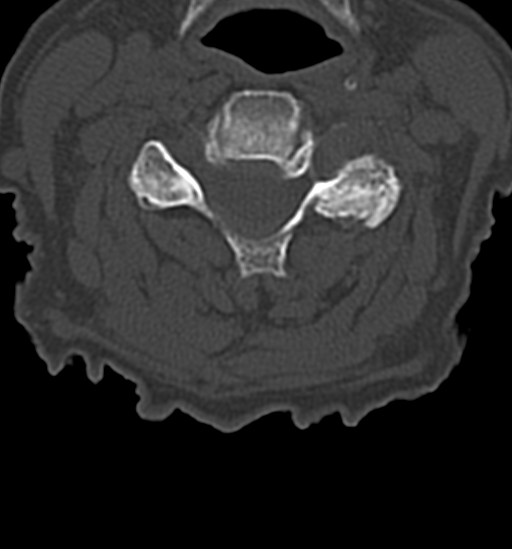
[im 61/92  brain]
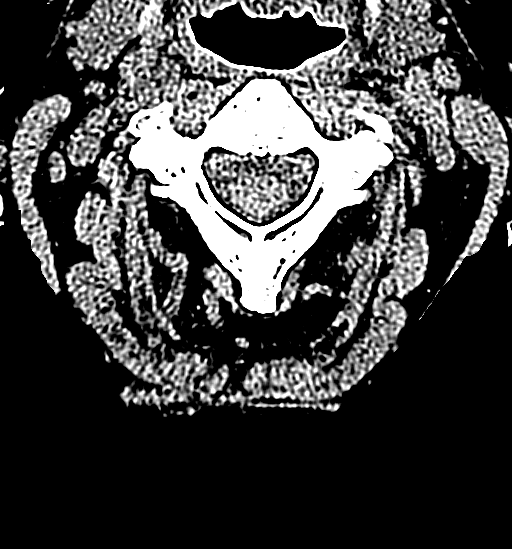
[im 71/92  brain]
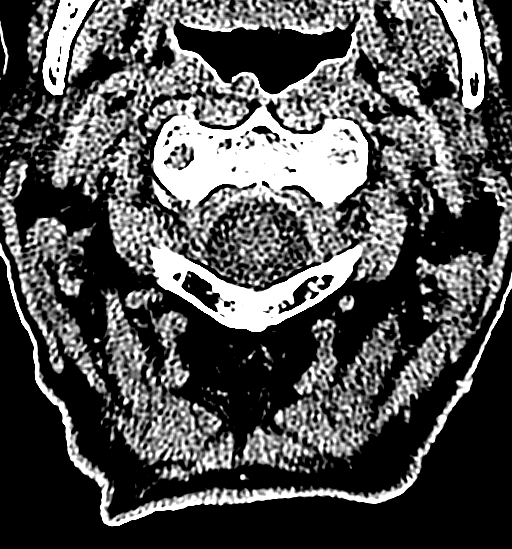
[im 81/92  brain]
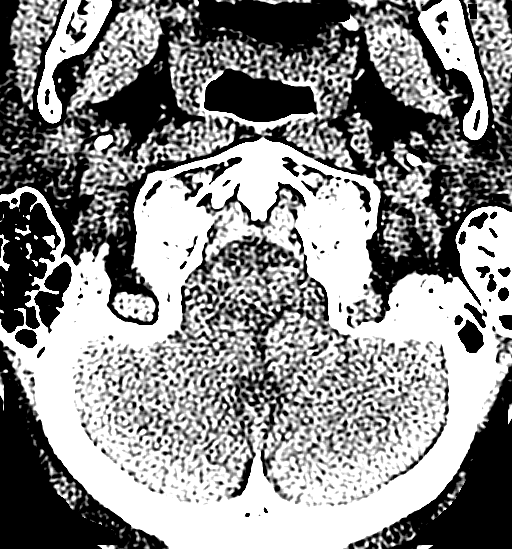

[17 of 30 positions shown; findings below may reference images not displayed]

FINDINGS: Cortical atrophy and chronic microvascular ischemic
change are again seen.  No evidence of acute abnormality including
infarction, hemorrhage, mass lesion, mass effect, midline shift or
abnormal extra-axial fluid collection is identified.  There is no
hydrocephalus or pneumocephalus.  The calvarium is intact.
IMPRESSION: No acute finding.  Atrophy and chronic microvascular ischemic
change.

CT CERVICAL SPINE
FINDINGS: No cervical fracture is identified.  Facet mediated
anterolisthesis of C4 on C5 is unchanged.  Marked loss of disc
space height at C5-6 and C6-7 is seen, unchanged.  Paraspinous
structures unremarkable.
IMPRESSION: No acute finding.  Degenerative disease.  Stable compared prior
exam.
